# Patient Record
Sex: Female | Born: 1980 | Race: White | Hispanic: No | Marital: Married | Smoking: Never smoker
Health system: Southern US, Community
[De-identification: ages and names within clinical notes are randomized; demographics above are authoritative.]

## PROBLEM LIST (undated history)

## (undated) DIAGNOSIS — F429 Obsessive-compulsive disorder, unspecified: Secondary | ICD-10-CM

## (undated) DIAGNOSIS — O021 Missed abortion: Secondary | ICD-10-CM

## (undated) DIAGNOSIS — R51 Headache: Secondary | ICD-10-CM

## (undated) DIAGNOSIS — Q519 Congenital malformation of uterus and cervix, unspecified: Secondary | ICD-10-CM

## (undated) DIAGNOSIS — O09529 Supervision of elderly multigravida, unspecified trimester: Secondary | ICD-10-CM

## (undated) DIAGNOSIS — J069 Acute upper respiratory infection, unspecified: Secondary | ICD-10-CM

## (undated) DIAGNOSIS — K219 Gastro-esophageal reflux disease without esophagitis: Secondary | ICD-10-CM

## (undated) DIAGNOSIS — E039 Hypothyroidism, unspecified: Secondary | ICD-10-CM

## (undated) HISTORY — DX: Headache: R51

## (undated) HISTORY — DX: Obsessive-compulsive disorder, unspecified: F42.9

## (undated) HISTORY — DX: Congenital malformation of uterus and cervix, unspecified: Q51.9

## (undated) HISTORY — PX: WISDOM TOOTH EXTRACTION: SHX21

## (undated) HISTORY — DX: Hypothyroidism, unspecified: E03.9

## (undated) HISTORY — DX: Supervision of elderly multigravida, unspecified trimester: O09.529

## (undated) HISTORY — DX: Missed abortion: O02.1

## (undated) HISTORY — DX: Gastro-esophageal reflux disease without esophagitis: K21.9

## (undated) HISTORY — DX: Acute upper respiratory infection, unspecified: J06.9

---

## 2003-11-17 ENCOUNTER — Ambulatory Visit (HOSPITAL_COMMUNITY): Admission: RE | Admit: 2003-11-17 | Discharge: 2003-11-17 | Payer: Self-pay | Admitting: Family Medicine

## 2005-06-11 ENCOUNTER — Other Ambulatory Visit: Admission: RE | Admit: 2005-06-11 | Discharge: 2005-06-11 | Payer: Self-pay | Admitting: *Deleted

## 2006-08-04 ENCOUNTER — Other Ambulatory Visit: Admission: RE | Admit: 2006-08-04 | Discharge: 2006-08-04 | Payer: Self-pay | Admitting: *Deleted

## 2007-08-27 ENCOUNTER — Other Ambulatory Visit: Admission: RE | Admit: 2007-08-27 | Discharge: 2007-08-27 | Payer: Self-pay | Admitting: *Deleted

## 2008-09-14 ENCOUNTER — Other Ambulatory Visit: Admission: RE | Admit: 2008-09-14 | Discharge: 2008-09-14 | Payer: Self-pay | Admitting: Family Medicine

## 2010-12-11 ENCOUNTER — Other Ambulatory Visit: Payer: Self-pay | Admitting: Obstetrics and Gynecology

## 2010-12-11 DIAGNOSIS — Q519 Congenital malformation of uterus and cervix, unspecified: Secondary | ICD-10-CM

## 2010-12-13 ENCOUNTER — Ambulatory Visit
Admission: RE | Admit: 2010-12-13 | Discharge: 2010-12-13 | Disposition: A | Discharge status: Home / Self Care | Payer: BC Managed Care – PPO | Source: Ambulatory Visit | Attending: Obstetrics and Gynecology | Admitting: Obstetrics and Gynecology

## 2010-12-13 DIAGNOSIS — Q519 Congenital malformation of uterus and cervix, unspecified: Secondary | ICD-10-CM

## 2010-12-13 MED ORDER — GADOBENATE DIMEGLUMINE 529 MG/ML IV SOLN
15.0000 mL | Freq: Once | INTRAVENOUS | Status: AC | PRN
  Administered 2010-12-13: 15 mL via INTRAVENOUS

## 2011-01-19 ENCOUNTER — Emergency Department (HOSPITAL_BASED_OUTPATIENT_CLINIC_OR_DEPARTMENT_OTHER)
Admission: EM | Admit: 2011-01-19 | Discharge: 2011-01-20 | Disposition: A | Discharge status: Home / Self Care | Payer: BC Managed Care – PPO | Attending: Emergency Medicine | Admitting: Emergency Medicine

## 2011-01-19 DIAGNOSIS — E039 Hypothyroidism, unspecified: Secondary | ICD-10-CM | POA: Insufficient documentation

## 2011-01-19 DIAGNOSIS — R197 Diarrhea, unspecified: Secondary | ICD-10-CM | POA: Insufficient documentation

## 2011-01-19 DIAGNOSIS — K5289 Other specified noninfective gastroenteritis and colitis: Secondary | ICD-10-CM | POA: Insufficient documentation

## 2011-01-19 DIAGNOSIS — R112 Nausea with vomiting, unspecified: Secondary | ICD-10-CM | POA: Insufficient documentation

## 2011-01-19 DIAGNOSIS — Z79899 Other long term (current) drug therapy: Secondary | ICD-10-CM | POA: Insufficient documentation

## 2011-01-19 LAB — URINALYSIS, ROUTINE W REFLEX MICROSCOPIC
Bilirubin Urine: NEGATIVE
Glucose, UA: NEGATIVE mg/dL
Hgb urine dipstick: NEGATIVE
Ketones, ur: 15 mg/dL — AB
Nitrite: NEGATIVE
Protein, ur: NEGATIVE mg/dL
Specific Gravity, Urine: 1.031 — ABNORMAL HIGH (ref 1.005–1.030)
Urobilinogen, UA: 0.2 mg/dL (ref 0.0–1.0)
pH: 6 (ref 5.0–8.0)

## 2011-01-19 LAB — PREGNANCY, URINE: Preg Test, Ur: NEGATIVE

## 2011-04-12 LAB — RPR: RPR: NONREACTIVE

## 2011-04-12 LAB — RUBELLA ANTIBODY, IGM: Rubella: UNDETERMINED

## 2011-04-12 LAB — ANTIBODY SCREEN: Antibody Screen: NEGATIVE

## 2011-04-12 LAB — HEPATITIS B SURFACE ANTIGEN: Hepatitis B Surface Ag: NEGATIVE

## 2011-04-12 LAB — ABO/RH: RH Type: POSITIVE

## 2011-04-12 LAB — HIV ANTIBODY (ROUTINE TESTING W REFLEX): HIV: NONREACTIVE

## 2011-05-06 LAB — GC/CHLAMYDIA PROBE AMP, GENITAL
Chlamydia: NEGATIVE
Gonorrhea: NEGATIVE

## 2011-10-23 LAB — STREP B DNA PROBE: GBS: POSITIVE

## 2011-11-05 NOTE — L&D Delivery Note (Signed)
Delivery Note At 8:27 PM a viable and healthy female was delivered via  (Presentation: ROA ).  APGAR: 2,pending 5 min Apgar ; weight pending .   Placenta status:spontaneous and intact with  3Vessel Cord:  with the following complications: .  Cord pH: pending  Anesthesia: Epidural  Episiotomy: none  Lacerations: second degree Suture Repair: 2.0 vicryl rapide Est. Blood Loss (mL): 300 NICU in attendance to evaluate neonate  Mom to postpartum.  Baby to nursery-stable.  Sary Bogie J 11/18/2011, 8:43 PM

## 2011-11-11 ENCOUNTER — Telehealth (HOSPITAL_COMMUNITY): Payer: Self-pay | Admitting: *Deleted

## 2011-11-11 ENCOUNTER — Encounter (HOSPITAL_COMMUNITY): Payer: Self-pay | Admitting: *Deleted

## 2011-11-11 NOTE — Telephone Encounter (Signed)
Preadmission screen  

## 2011-11-17 ENCOUNTER — Other Ambulatory Visit: Payer: Self-pay | Admitting: Obstetrics and Gynecology

## 2011-11-18 ENCOUNTER — Inpatient Hospital Stay (HOSPITAL_COMMUNITY): Payer: BC Managed Care – PPO | Admitting: Anesthesiology

## 2011-11-18 ENCOUNTER — Inpatient Hospital Stay (HOSPITAL_COMMUNITY)
Admission: RE | Admit: 2011-11-18 | Discharge: 2011-11-20 | DRG: 373 | Disposition: A | Discharge status: Home / Self Care | Payer: BC Managed Care – PPO | Source: Ambulatory Visit | Attending: Obstetrics and Gynecology | Admitting: Obstetrics and Gynecology

## 2011-11-18 ENCOUNTER — Encounter (HOSPITAL_COMMUNITY): Payer: Self-pay

## 2011-11-18 ENCOUNTER — Encounter (HOSPITAL_COMMUNITY): Payer: Self-pay | Admitting: Anesthesiology

## 2011-11-18 DIAGNOSIS — Z22330 Carrier of Group B streptococcus: Secondary | ICD-10-CM

## 2011-11-18 DIAGNOSIS — E079 Disorder of thyroid, unspecified: Secondary | ICD-10-CM | POA: Diagnosis present

## 2011-11-18 DIAGNOSIS — O99892 Other specified diseases and conditions complicating childbirth: Secondary | ICD-10-CM | POA: Diagnosis present

## 2011-11-18 DIAGNOSIS — E039 Hypothyroidism, unspecified: Secondary | ICD-10-CM | POA: Diagnosis present

## 2011-11-18 DIAGNOSIS — O99284 Endocrine, nutritional and metabolic diseases complicating childbirth: Secondary | ICD-10-CM | POA: Diagnosis present

## 2011-11-18 DIAGNOSIS — O409XX0 Polyhydramnios, unspecified trimester, not applicable or unspecified: Principal | ICD-10-CM | POA: Diagnosis present

## 2011-11-18 LAB — CBC
HCT: 35.5 % — ABNORMAL LOW (ref 36.0–46.0)
Hemoglobin: 11.8 g/dL — ABNORMAL LOW (ref 12.0–15.0)
MCH: 28 pg (ref 26.0–34.0)
MCHC: 33.2 g/dL (ref 30.0–36.0)
MCV: 84.3 fL (ref 78.0–100.0)
Platelets: 198 10*3/uL (ref 150–400)
RBC: 4.21 MIL/uL (ref 3.87–5.11)
RDW: 14.3 % (ref 11.5–15.5)
WBC: 9.8 10*3/uL (ref 4.0–10.5)

## 2011-11-18 LAB — RPR: RPR Ser Ql: NONREACTIVE

## 2011-11-18 MED ORDER — PRENATAL MULTIVITAMIN CH
1.0000 | ORAL_TABLET | Freq: Every day | ORAL | Status: DC
  Administered 2011-11-19 – 2011-11-20 (×2): 1 via ORAL
  Filled 2011-11-18 (×2): qty 1

## 2011-11-18 MED ORDER — PENICILLIN G POTASSIUM 5000000 UNITS IJ SOLR
2.5000 10*6.[IU] | INTRAVENOUS | Status: DC
  Administered 2011-11-18 (×3): 2.5 10*6.[IU] via INTRAVENOUS
  Filled 2011-11-18 (×5): qty 2.5

## 2011-11-18 MED ORDER — OXYTOCIN 20 UNITS IN LACTATED RINGERS INFUSION - SIMPLE
1.0000 m[IU]/min | INTRAVENOUS | Status: DC
  Administered 2011-11-18: 2 m[IU]/min via INTRAVENOUS
  Filled 2011-11-18: qty 1000

## 2011-11-18 MED ORDER — ONDANSETRON HCL 4 MG PO TABS: 4.0000 mg | ORAL_TABLET | ORAL | Status: DC | PRN

## 2011-11-18 MED ORDER — TETANUS-DIPHTH-ACELL PERTUSSIS 5-2.5-18.5 LF-MCG/0.5 IM SUSP
0.5000 mL | Freq: Once | INTRAMUSCULAR | Status: DC
  Filled 2011-11-18: qty 0.5

## 2011-11-18 MED ORDER — WITCH HAZEL-GLYCERIN EX PADS: 1.0000 "application " | MEDICATED_PAD | CUTANEOUS | Status: DC | PRN

## 2011-11-18 MED ORDER — PENICILLIN G POTASSIUM 5000000 UNITS IJ SOLR
5.0000 10*6.[IU] | Freq: Once | INTRAVENOUS | Status: AC
  Administered 2011-11-18: 5 10*6.[IU] via INTRAVENOUS
  Filled 2011-11-18: qty 5

## 2011-11-18 MED ORDER — IBUPROFEN 600 MG PO TABS
600.0000 mg | ORAL_TABLET | Freq: Four times a day (QID) | ORAL | Status: DC
  Administered 2011-11-19 – 2011-11-20 (×7): 600 mg via ORAL
  Filled 2011-11-18 (×7): qty 1

## 2011-11-18 MED ORDER — DIPHENHYDRAMINE HCL 25 MG PO CAPS: 25.0000 mg | ORAL_CAPSULE | Freq: Four times a day (QID) | ORAL | Status: DC | PRN

## 2011-11-18 MED ORDER — LACTATED RINGERS IV SOLN: 500.0000 mL | INTRAVENOUS | Status: DC | PRN

## 2011-11-18 MED ORDER — ZOLPIDEM TARTRATE 10 MG PO TABS: 10.0000 mg | ORAL_TABLET | Freq: Every evening | ORAL | Status: DC | PRN

## 2011-11-18 MED ORDER — LEVOTHYROXINE SODIUM 75 MCG PO TABS
75.0000 ug | ORAL_TABLET | Freq: Every day | ORAL | Status: DC
  Administered 2011-11-19 – 2011-11-20 (×2): 75 ug via ORAL
  Filled 2011-11-18 (×3): qty 1

## 2011-11-18 MED ORDER — PHENYLEPHRINE 40 MCG/ML (10ML) SYRINGE FOR IV PUSH (FOR BLOOD PRESSURE SUPPORT): 80.0000 ug | PREFILLED_SYRINGE | INTRAVENOUS | Status: DC | PRN

## 2011-11-18 MED ORDER — LACTATED RINGERS IV SOLN
INTRAVENOUS | Status: DC
  Administered 2011-11-18 (×2): via INTRAVENOUS

## 2011-11-18 MED ORDER — OXYTOCIN BOLUS FROM INFUSION
500.0000 mL | Freq: Once | INTRAVENOUS | Status: DC
  Filled 2011-11-18: qty 500

## 2011-11-18 MED ORDER — EPHEDRINE 5 MG/ML INJ: 10.0000 mg | INTRAVENOUS | Status: DC | PRN

## 2011-11-18 MED ORDER — OXYTOCIN 20 UNITS IN LACTATED RINGERS INFUSION - SIMPLE: 125.0000 mL/h | INTRAVENOUS | Status: DC

## 2011-11-18 MED ORDER — FLUOXETINE HCL 20 MG PO CAPS
40.0000 mg | ORAL_CAPSULE | Freq: Two times a day (BID) | ORAL | Status: DC
  Administered 2011-11-18 – 2011-11-20 (×4): 40 mg via ORAL
  Filled 2011-11-18 (×6): qty 2

## 2011-11-18 MED ORDER — LIDOCAINE HCL 1.5 % IJ SOLN
INTRAMUSCULAR | Status: DC | PRN
  Administered 2011-11-18 (×2): 4 mL via EPIDURAL

## 2011-11-18 MED ORDER — IBUPROFEN 600 MG PO TABS: 600.0000 mg | ORAL_TABLET | Freq: Four times a day (QID) | ORAL | Status: DC | PRN

## 2011-11-18 MED ORDER — DIPHENHYDRAMINE HCL 50 MG/ML IJ SOLN: 12.5000 mg | INTRAMUSCULAR | Status: DC | PRN

## 2011-11-18 MED ORDER — ZOLPIDEM TARTRATE 5 MG PO TABS: 5.0000 mg | ORAL_TABLET | Freq: Every evening | ORAL | Status: DC | PRN

## 2011-11-18 MED ORDER — CITRIC ACID-SODIUM CITRATE 334-500 MG/5ML PO SOLN: 30.0000 mL | ORAL | Status: DC | PRN

## 2011-11-18 MED ORDER — ACETAMINOPHEN 325 MG PO TABS: 650.0000 mg | ORAL_TABLET | ORAL | Status: DC | PRN

## 2011-11-18 MED ORDER — DIBUCAINE 1 % RE OINT: 1.0000 "application " | TOPICAL_OINTMENT | RECTAL | Status: DC | PRN

## 2011-11-18 MED ORDER — FENTANYL 2.5 MCG/ML BUPIVACAINE 1/10 % EPIDURAL INFUSION (WH - ANES)
INTRAMUSCULAR | Status: DC | PRN
  Administered 2011-11-18: 14 mL/h via EPIDURAL

## 2011-11-18 MED ORDER — OXYCODONE-ACETAMINOPHEN 5-325 MG PO TABS: 1.0000 | ORAL_TABLET | ORAL | Status: DC | PRN

## 2011-11-18 MED ORDER — METHYLERGONOVINE MALEATE 0.2 MG PO TABS: 0.2000 mg | ORAL_TABLET | ORAL | Status: DC | PRN

## 2011-11-18 MED ORDER — OXYTOCIN 20 UNITS IN LACTATED RINGERS INFUSION - SIMPLE
125.0000 mL/h | Freq: Once | INTRAVENOUS | Status: AC
  Administered 2011-11-18: 125 mL/h via INTRAVENOUS

## 2011-11-18 MED ORDER — EPHEDRINE 5 MG/ML INJ
10.0000 mg | INTRAVENOUS | Status: DC | PRN
  Filled 2011-11-18: qty 4

## 2011-11-18 MED ORDER — LIDOCAINE HCL (PF) 1 % IJ SOLN
30.0000 mL | INTRAMUSCULAR | Status: DC | PRN
  Filled 2011-11-18: qty 30

## 2011-11-18 MED ORDER — FENTANYL 2.5 MCG/ML BUPIVACAINE 1/10 % EPIDURAL INFUSION (WH - ANES)
14.0000 mL/h | INTRAMUSCULAR | Status: DC
  Administered 2011-11-18: 14 mL/h via EPIDURAL
  Filled 2011-11-18 (×2): qty 60

## 2011-11-18 MED ORDER — LANOLIN HYDROUS EX OINT: TOPICAL_OINTMENT | CUTANEOUS | Status: DC | PRN

## 2011-11-18 MED ORDER — BENZOCAINE-MENTHOL 20-0.5 % EX AERO
1.0000 "application " | INHALATION_SPRAY | CUTANEOUS | Status: DC | PRN
  Administered 2011-11-19: 1 via TOPICAL

## 2011-11-18 MED ORDER — SENNOSIDES-DOCUSATE SODIUM 8.6-50 MG PO TABS
2.0000 | ORAL_TABLET | Freq: Every day | ORAL | Status: DC
  Administered 2011-11-19: 2 via ORAL

## 2011-11-18 MED ORDER — PHENYLEPHRINE 40 MCG/ML (10ML) SYRINGE FOR IV PUSH (FOR BLOOD PRESSURE SUPPORT)
80.0000 ug | PREFILLED_SYRINGE | INTRAVENOUS | Status: DC | PRN
  Filled 2011-11-18: qty 5

## 2011-11-18 MED ORDER — LACTATED RINGERS IV SOLN: 500.0000 mL | Freq: Once | INTRAVENOUS | Status: DC

## 2011-11-18 MED ORDER — ONDANSETRON HCL 4 MG/2ML IJ SOLN: 4.0000 mg | INTRAMUSCULAR | Status: DC | PRN

## 2011-11-18 MED ORDER — SIMETHICONE 80 MG PO CHEW: 80.0000 mg | CHEWABLE_TABLET | ORAL | Status: DC | PRN

## 2011-11-18 MED ORDER — OXYCODONE-ACETAMINOPHEN 5-325 MG PO TABS: 2.0000 | ORAL_TABLET | ORAL | Status: DC | PRN

## 2011-11-18 MED ORDER — FLEET ENEMA 7-19 GM/118ML RE ENEM: 1.0000 | ENEMA | RECTAL | Status: DC | PRN

## 2011-11-18 MED ORDER — METHYLERGONOVINE MALEATE 0.2 MG/ML IJ SOLN: 0.2000 mg | INTRAMUSCULAR | Status: DC | PRN

## 2011-11-18 MED ORDER — TERBUTALINE SULFATE 1 MG/ML IJ SOLN: 0.2500 mg | Freq: Once | INTRAMUSCULAR | Status: DC | PRN

## 2011-11-18 MED ORDER — ONDANSETRON HCL 4 MG/2ML IJ SOLN: 4.0000 mg | Freq: Four times a day (QID) | INTRAMUSCULAR | Status: DC | PRN

## 2011-11-18 NOTE — Anesthesia Preprocedure Evaluation (Signed)
Anesthesia Evaluation  Patient identified by MRN, date of birth, ID band Patient awake    Reviewed: Allergy & Precautions, H&P , Patient's Chart, lab work & pertinent test results  Airway Mallampati: III TM Distance: >3 FB Neck ROM: full    Dental No notable dental hx. (+) Teeth Intact   Pulmonary neg pulmonary ROS,  clear to auscultation  Pulmonary exam normal       Cardiovascular neg cardio ROS regular Normal    Neuro/Psych  Headaches, Negative Psych ROS   GI/Hepatic negative GI ROS, Neg liver ROS, Medicated and Controlled,  Endo/Other  Morbid obesity  Renal/GU negative Renal ROS  Genitourinary negative   Musculoskeletal   Abdominal   Peds  Hematology negative hematology ROS (+)   Anesthesia Other Findings   Reproductive/Obstetrics (+) Pregnancy                           Anesthesia Physical Anesthesia Plan  ASA: II  Anesthesia Plan: Epidural   Post-op Pain Management:    Induction:   Airway Management Planned:   Additional Equipment:   Intra-op Plan:   Post-operative Plan:   Informed Consent: I have reviewed the patients History and Physical, chart, labs and discussed the procedure including the risks, benefits and alternatives for the proposed anesthesia with the patient or authorized representative who has indicated his/her understanding and acceptance.     Plan Discussed with: Anesthesiologist  Anesthesia Plan Comments:         Anesthesia Quick Evaluation

## 2011-11-18 NOTE — H&P (Signed)
NAMEMARCINA, [name] NO.:  [id number]  MEDICAL RECORD NO.:  [id number]  LOCATION:  9165                          FACILITY:  [facility]  PHYSICIAN:  Lenoard Aden, M.D.DATE OF BIRTH:  [birth year]  DATE OF ADMISSION:  11/18/2011 DATE OF DISCHARGE:                             HISTORY & PHYSICAL   CHIEF COMPLAINT:  Polyhydramnios.  HISTORY OF PRESENT ILLNESS:  She is a 31 year old white female G3, P0 at [redacted] weeks gestation at 33 and 4/7th weeks gestation who presents for induction due to moderate polyhydramnios with an AFI greater than 30. She has medications to include thyroid replacement therapy, prenatal vitamins, Protonix, and Prozac.  She is a nonsmoker, nondrinker.  She denies domestic or physical violence.  She has a personal history of depression and anxiety and obsessive-compulsive disorder.  She has a pregnancy history remarkable for 2 previous pregnancy losses.  She has a family history of depression, hypertension, anxiety, colon cancer, stroke, thyroid disease, and lung cancer.  Prenatal course as noted complicated by moderate hydramnios, polyhydramnios, and group B Strep positive.  PHYSICAL EXAMINATION:  GENERAL:  She is a well-developed, well- nourished, white female, in no acute distress. VITAL SIGNS:  Weight of 208 pounds, height of 64 inches. HEENT:  Normal. NECK:  Supple.  Full range of motion. LUNGS:  Clear. HEART:  Regular rate and rhythm. ABDOMEN:  Soft, gravid, nontender. PELVIC:  Estimated fetal weight 7.5 pounds.  Cervix is 3, 70%, vertex, - 1.  EXTREMITIES:  No cords. NEUROLOGIC:  Nonfocal. SKIN:  Intact.  IMPRESSION: 1. A 39-4/7th week intrauterine pregnancy. 2. Moderate polyhydramnios with an AFI of 30.5 noted. 3. Group B strep positive. 4. History of depression.  PLAN:  Admit, induction, Pitocin, epidural as needed.  Penicillin prophylaxis, anticipate attempts at vaginal delivery.     Lenoard Aden, M.D.     RJT/MEDQ   D:  11/17/2011  T:  11/18/2011  Job:  [id number]

## 2011-11-18 NOTE — Progress Notes (Signed)
[name] is a 31 y.o. G3P0020 at [redacted]w[redacted]d by LMP admitted for moderate polyhydramnios.  Subjective: No complaints  Objective: LMP 02/15/2011      FHT:  FHR: 145 bpm, variability: moderate,  accelerations:  Present,  decelerations:  Absent UC:   irregular, every 7 minutes SVE:    2-3/8-/-1 AROM- clear and copious  Labs: No results found for this basename: WBC, HGB, HCT, MCV, PLT    Assessment / Plan: Induction of labor due to polyhydramnios,  progressing well on pitocin  Labor: AROM and Pitocin Preeclampsia:  na Fetal Wellbeing:  Category I Pain Control:  Labor support without medications I/D:  n/a Anticipated MOD:  NSVD  Purcell Jungbluth J 11/18/2011, 7:03 AM

## 2011-11-18 NOTE — Anesthesia Procedure Notes (Signed)
Epidural Patient location during procedure: OB Start time: 11/18/2011 2:22 PM  Staffing Anesthesiologist: Evoleth Nordmeyer A. Performed by: anesthesiologist   Preanesthetic Checklist Completed: patient identified, site marked, surgical consent, pre-op evaluation, timeout performed, IV checked, risks and benefits discussed and monitors and equipment checked  Epidural Patient position: sitting Prep: site prepped and draped and DuraPrep Patient monitoring: continuous pulse ox and blood pressure Approach: midline Injection technique: LOR air  Needle:  Needle type: Tuohy  Needle gauge: 17 G Needle length: 9 cm Needle insertion depth: 5 cm cm Catheter type: closed end flexible Catheter size: 19 Gauge Catheter at skin depth: 10 cm Test dose: negative and 1.5% lidocaine  Assessment Events: blood not aspirated, injection not painful, no injection resistance, negative IV test and no paresthesia  Additional Notes Patient is more comfortable after epidural dosed. Please see RN's note for documentation of vital signs and FHR which are stable.

## 2011-11-18 NOTE — Progress Notes (Signed)
[name] is a 31 y.o. G3P0020 at [redacted]w[redacted]d by LMP admitted for moderate polyhydramnios.l  Subjective: Getting uncomfortable   Objective: BP 132/86  Pulse 71  Temp(Src) 98.1 F (36.7 C) (Oral)  Resp 18  Ht 5\' 4"  (1.626 m)  Wt 94.348 kg (208 lb)  BMI 35.70 kg/m2  LMP 02/15/2011      FHT:  FHR: 155 bpm, variability: moderate,  accelerations:  Present,  decelerations:  Absent UC:   irregular, every 2-5 minutes SVE:   Dilation: 3.5 Effacement (%): 80 Station: 0 Exam by:: Dr [name]  Labs: Lab Results  Component Value Date   WBC 9.8 11/18/2011   HGB 11.8* 11/18/2011   HCT 35.5* 11/18/2011   MCV 84.3 11/18/2011   PLT 198 11/18/2011    Assessment / Plan: Induction of labor due to moderate hydramnios,  progressing well on pitocin  Labor: Progressing normally in prodromal phase. Preeclampsia:  na Fetal Wellbeing:  Category I Pain Control:  Labor support without medications I/D:  n/a Anticipated MOD:  NSVD  [name] J 11/18/2011, 2:25 PM

## 2011-11-19 ENCOUNTER — Encounter (HOSPITAL_COMMUNITY): Payer: Self-pay

## 2011-11-19 LAB — CBC
HCT: 33.3 % — ABNORMAL LOW (ref 36.0–46.0)
Hemoglobin: 10.9 g/dL — ABNORMAL LOW (ref 12.0–15.0)
MCH: 27.9 pg (ref 26.0–34.0)
MCHC: 32.7 g/dL (ref 30.0–36.0)
MCV: 85.2 fL (ref 78.0–100.0)
Platelets: 156 10*3/uL (ref 150–400)
RBC: 3.91 MIL/uL (ref 3.87–5.11)
RDW: 14.1 % (ref 11.5–15.5)
WBC: 16.3 10*3/uL — ABNORMAL HIGH (ref 4.0–10.5)

## 2011-11-19 MED ORDER — BENZOCAINE-MENTHOL 20-0.5 % EX AERO
INHALATION_SPRAY | CUTANEOUS | Status: AC
  Filled 2011-11-19: qty 56

## 2011-11-19 NOTE — Anesthesia Postprocedure Evaluation (Signed)
  Anesthesia Post-op Note  Patient: [name]  Procedure(s) Performed: * No procedures listed *  Patient Location: Mother/Baby  Anesthesia Type: Epidural  Level of Consciousness: awake, alert  and oriented  Airway and Oxygen Therapy: Patient Spontanous Breathing  Post-op Pain: mild  Post-op Assessment: Patient's Cardiovascular Status Stable, Respiratory Function Stable, Patent Airway, No signs of Nausea or vomiting and Pain level controlled  Post-op Vital Signs: stable  Complications: No apparent anesthesia complications

## 2011-11-19 NOTE — Progress Notes (Signed)
PPD 1 SVD  S:  Reports feeling well.             Tolerating po/ No nausea or vomiting             Bleeding is moderate             Pain controlled with Motrin.             Up ad lib / ambulatory  Newborn  Information for the patient's newborn:  Mattisyn, Cardona Girl Guilianna [[id number]]  female  breast feeding , no latch yet   O:  A & O x 3 NAD             VS: Blood pressure 119/78, pulse 93, temperature 97.7 F (36.5 C), temperature source Oral, resp. rate 20, height 5\' 4"  (1.626 m), weight 94.348 kg (208 lb), last menstrual period 02/15/2011, SpO2 97.00%, unknown if currently breastfeeding.  LABS:  Basename 11/19/11 0522 11/18/11 0728  HGB 10.9* 11.8*  HCT 33.3* 35.5*    I&O: I/O last 3 completed shifts: In: -  Out: 300 [Blood:300]      Lungs: Clear and unlabored  Heart: regular rate and rhythm / no mumurs  Abdomen: soft, non-tender, non-distended , obese             Fundus: firm, non-tender, @U   Perineum: repair intact, no edema  Lochia: small  Extremities: no edema, no calf pain or tenderness, neg Homans    A/P: PPD # 1 30 y.o., [id number] S/P:induced vaginal   Principal Problem:  *PP care - s/p SVD 1/14    Doing well - stable status  Routine post partum orders  Lactation support for latch today  Liani Caris, CNM, MSN 11/19/2011, 10:16 AM

## 2011-11-20 MED ORDER — IBUPROFEN 600 MG PO TABS: 600.0000 mg | ORAL_TABLET | Freq: Four times a day (QID) | ORAL | Status: AC

## 2011-11-20 MED ORDER — MEASLES, MUMPS & RUBELLA VAC ~~LOC~~ INJ
0.5000 mL | INJECTION | Freq: Once | SUBCUTANEOUS | Status: AC
  Administered 2011-11-20: 0.5 mL via SUBCUTANEOUS
  Filled 2011-11-20: qty 0.5

## 2011-11-20 NOTE — Plan of Care (Signed)
Problem: Discharge Progression Outcomes Goal: Barriers To Progression Addressed/Resolved Outcome: Not Applicable Date Met:  11/20/11 Baby staying as a baby pt because of poor feedings

## 2011-11-20 NOTE — Discharge Summary (Signed)
Obstetric Discharge Summary Reason for Admission: induction of labor - polyhydramnios / hypothyroidism Prenatal Procedures: NST and ultrasound Intrapartum Procedures: spontaneous vaginal delivery Postpartum Procedures: none Complications-Operative and Postpartum: 2nd  degree perineal laceration Hemoglobin  Date Value Range Status  11/19/2011 10.9* 12.0-15.0 (g/dL) Final     HCT  Date Value Range Status  11/19/2011 33.3* 36.0-46.0 (%) Final    Discharge Diagnoses: Term Pregnancy-delivered  Discharge Information: Date: 11/20/2011 Activity: pelvic rest Diet: routine Medications: PNV, Ibuprofen and prozac and levothyroxine Condition: stable Instructions: refer to practice specific booklet Discharge to: home   Newborn Data: Live born female  Birth Weight: 7 lb 6.5 oz (3360 g) APGAR: 2, 7  Rooming-in for next 24-48 hours with baby Home with mother when peds clears newborn for discharge.  [name] 11/20/2011, 9:48 AM

## 2011-11-20 NOTE — Progress Notes (Signed)
Patient ID: [name], female   DOB: 1981/08/17, 31 y.o.   MRN: [id number]  PPD 2 SVD  S:  Reports feeling soreness in bottom             Tolerating po/ No nausea or vomiting             Bleeding is light             Pain controlled with motrin             Up ad lib / ambulatory  Newborn breast feeding & pumping  / female newborn - not cleared for discharge today   O:  A & O x 3 NAD             VS: Blood pressure 108/71, pulse 85, temperature 98.3 F (36.8 C), temperature source Oral, resp. rate 20, height 5\' 4"  (1.626 m), weight 94.348 kg (208 lb), last menstrual period 02/15/2011, SpO2 97.00%, unknown if currently breastfeeding.  Lungs: Clear and unlabored  Heart: regular rate and rhythm / no mumurs  Abdomen: soft, non-tender, non-distended              Fundus: firm, non-tender, U+1 to Right  Perineum: resolving edema  Lochia: light  Extremities: no edema, no calf pain or tenderness    A: PPD # 2   Doing well - stable status  P:  Routine post partum orders  Discharge MOM - will room-in with baby             Reminded to empty bladder routinely every 2-4 hours             Discharge instructions reviewed  [name] 11/20/2011, 9:41 AM

## 2011-11-22 ENCOUNTER — Inpatient Hospital Stay (HOSPITAL_COMMUNITY): Admission: AD | Admit: 2011-11-22 | Payer: Self-pay | Source: Ambulatory Visit | Admitting: Obstetrics and Gynecology

## 2013-07-09 ENCOUNTER — Encounter (HOSPITAL_BASED_OUTPATIENT_CLINIC_OR_DEPARTMENT_OTHER): Payer: Self-pay | Admitting: *Deleted

## 2013-07-09 ENCOUNTER — Emergency Department (HOSPITAL_BASED_OUTPATIENT_CLINIC_OR_DEPARTMENT_OTHER)
Admission: EM | Admit: 2013-07-09 | Discharge: 2013-07-09 | Disposition: A | Discharge status: Home / Self Care | Payer: BC Managed Care – PPO | Attending: Emergency Medicine | Admitting: Emergency Medicine

## 2013-07-09 DIAGNOSIS — Z8659 Personal history of other mental and behavioral disorders: Secondary | ICD-10-CM | POA: Insufficient documentation

## 2013-07-09 DIAGNOSIS — S61209A Unspecified open wound of unspecified finger without damage to nail, initial encounter: Secondary | ICD-10-CM | POA: Insufficient documentation

## 2013-07-09 DIAGNOSIS — Z8709 Personal history of other diseases of the respiratory system: Secondary | ICD-10-CM | POA: Insufficient documentation

## 2013-07-09 DIAGNOSIS — Y929 Unspecified place or not applicable: Secondary | ICD-10-CM | POA: Insufficient documentation

## 2013-07-09 DIAGNOSIS — E039 Hypothyroidism, unspecified: Secondary | ICD-10-CM | POA: Insufficient documentation

## 2013-07-09 DIAGNOSIS — Z79899 Other long term (current) drug therapy: Secondary | ICD-10-CM | POA: Insufficient documentation

## 2013-07-09 DIAGNOSIS — Q519 Congenital malformation of uterus and cervix, unspecified: Secondary | ICD-10-CM | POA: Insufficient documentation

## 2013-07-09 DIAGNOSIS — Y939 Activity, unspecified: Secondary | ICD-10-CM | POA: Insufficient documentation

## 2013-07-09 DIAGNOSIS — W260XXA Contact with knife, initial encounter: Secondary | ICD-10-CM | POA: Insufficient documentation

## 2013-07-09 DIAGNOSIS — K219 Gastro-esophageal reflux disease without esophagitis: Secondary | ICD-10-CM | POA: Insufficient documentation

## 2013-07-09 DIAGNOSIS — Z23 Encounter for immunization: Secondary | ICD-10-CM | POA: Insufficient documentation

## 2013-07-09 MED ORDER — TETANUS-DIPHTH-ACELL PERTUSSIS 5-2.5-18.5 LF-MCG/0.5 IM SUSP
0.5000 mL | Freq: Once | INTRAMUSCULAR | Status: AC
  Administered 2013-07-09: 0.5 mL via INTRAMUSCULAR
  Filled 2013-07-09: qty 0.5

## 2013-07-09 NOTE — ED Provider Notes (Signed)
CSN: [id number]     Arrival date & time 07/09/13  1134 History   First MD Initiated Contact with Patient 07/09/13 1227     Chief Complaint  Patient presents with  . Laceration   (Consider location/radiation/quality/duration/timing/severity/associated sxs/prior Treatment) HPI Patient presents one day after sustaining fingertip injury.  She was using a knife, avulsed the distal end of her fourth digit on the left hand. Bleeding was initially controlled with pressure, dressing.  Bleeding recurred today after a dressing change.  No distal dysesthesia, weakness, inability to move the finger or hand. No other complaints, no other injuries.   Past Medical History  Diagnosis Date  . Hypothyroidism   . Other anomalies of uterus   . Missed abortion   . AMA (advanced maternal age) multigravida 35+   . Headache(784.0)   . Obsessive-compulsive disorders   . Acute upper respiratory infections of unspecified site   . GERD (gastroesophageal reflux disease)   . PP care - s/p SVD 11/19/2011   Past Surgical History  Procedure Laterality Date  . Wisdom tooth extraction     Family History  Problem Relation Age of Onset  . Hypertension Mother   . Depression Mother   . Miscarriages / [place] Mother   . Nephrolithiasis Father   . Anxiety disorder Father   . Psoriasis Father   . Hypothyroidism Sister   . Depression Sister   . Anxiety disorder Sister   . Cancer Maternal Grandfather     lung  . Hypothyroidism Paternal Grandmother   . Anxiety disorder Paternal Grandmother   . Cancer Paternal Grandfather     colon  . Stroke Paternal Grandfather    History  Substance Use Topics  . Smoking status: Never Smoker   . Smokeless tobacco: Never Used  . Alcohol Use: Yes     Comment: 5-6 drinks since pos preg test   OB History   Grav Para Term Preterm Abortions TAB SAB Ect Mult Living   3 1 1  2  2   1      Review of Systems  All other systems reviewed and are negative.    Allergies   Review of patient's allergies indicates no known allergies.  Home Medications   Current Outpatient Rx  Name  Route  Sig  Dispense  Refill  . acetaminophen (TYLENOL) 500 MG tablet   Oral   Take 500 mg by mouth every 6 (six) hours as needed. For pain         . Docosahexaenoic Acid (DHA OMEGA 3 PO)   Oral   Take 1 capsule by mouth daily.         Marland Kitchen FLUoxetine (PROZAC) 20 MG capsule   Oral   Take 40 mg by mouth 2 (two) times daily.         Marland Kitchen levothyroxine (SYNTHROID, LEVOTHROID) 75 MCG tablet   Oral   Take 75 mcg by mouth daily.         . pantoprazole (PROTONIX) 40 MG tablet   Oral   Take 40 mg by mouth daily as needed. For heartburn         . Prenatal Vit-Fe Fumarate-FA (PRENATAL MULTIVITAMIN) TABS   Oral   Take 1 tablet by mouth daily.          BP 115/74  Pulse 97  Temp(Src) 98.6 F (37 C) (Oral)  Resp 20  Ht 5\' 4"  (1.626 m)  Wt 175 lb (79.379 kg)  BMI 30.02 kg/m2  SpO2 99%  LMP  06/25/2013 Physical Exam  Nursing note and vitals reviewed. Constitutional: She appears well-developed and well-nourished. No distress.  HENT:  Head: Normocephalic and atraumatic.  Eyes: Conjunctivae are normal. Right eye exhibits no discharge. Left eye exhibits no discharge.  Cardiovascular: Normal rate, regular rhythm and intact distal pulses.   Musculoskeletal:  The distal medial third of the fourth fingertip is avulsed.  There is mild active bleeding.  Patient can flex and extend digit appropriately, Refill is appropriate.  Skin: Skin is warm and dry. She is not diaphoretic.  Psychiatric: She has a normal mood and affect.    ED Course  Procedures (including critical care time) Labs Review Labs Reviewed - No data to display Imaging Review No results found.  Wound care treatment procedure Indication active bleeding, fingertip avulsion. Consent verbal Fingertip was irrigated with tap water. Topical dressing applied. Wound care well tolerated.  MDM   1. Avulsion  of fingertip, initial encounter    Healthy young female presents several hours after avulsing the fingertip of her left fourth digit.  Woodhead dressing applied with control of bleeding.  With no new complaints, tetanus was provided and the patient was discharged in stable condition.    Gerhard Munch, MD 07/12/13 2306

## 2013-07-09 NOTE — ED Notes (Signed)
Pt reports cut left ring finger last night with knife around 2045. Unable to get bleeding to stop. Approximately 1/4 inch in length. Last tetanus in 2008 or 2010.

## 2014-09-05 ENCOUNTER — Encounter (HOSPITAL_BASED_OUTPATIENT_CLINIC_OR_DEPARTMENT_OTHER): Payer: Self-pay | Admitting: *Deleted

## 2016-09-18 DIAGNOSIS — L7211 Pilar cyst: Secondary | ICD-10-CM | POA: Diagnosis not present

## 2016-09-18 DIAGNOSIS — L700 Acne vulgaris: Secondary | ICD-10-CM | POA: Diagnosis not present

## 2016-09-18 DIAGNOSIS — D225 Melanocytic nevi of trunk: Secondary | ICD-10-CM | POA: Diagnosis not present

## 2016-09-18 DIAGNOSIS — D2261 Melanocytic nevi of right upper limb, including shoulder: Secondary | ICD-10-CM | POA: Diagnosis not present

## 2017-09-14 ENCOUNTER — Other Ambulatory Visit: Payer: Self-pay

## 2017-09-14 ENCOUNTER — Encounter (HOSPITAL_BASED_OUTPATIENT_CLINIC_OR_DEPARTMENT_OTHER): Payer: Self-pay | Admitting: Emergency Medicine

## 2017-09-14 ENCOUNTER — Emergency Department (HOSPITAL_BASED_OUTPATIENT_CLINIC_OR_DEPARTMENT_OTHER)
Admission: EM | Admit: 2017-09-14 | Discharge: 2017-09-14 | Disposition: A | Discharge status: Home / Self Care | Payer: BLUE CROSS/BLUE SHIELD | Attending: Emergency Medicine | Admitting: Emergency Medicine

## 2017-09-14 DIAGNOSIS — J029 Acute pharyngitis, unspecified: Secondary | ICD-10-CM | POA: Diagnosis not present

## 2017-09-14 DIAGNOSIS — Z79899 Other long term (current) drug therapy: Secondary | ICD-10-CM | POA: Diagnosis not present

## 2017-09-14 DIAGNOSIS — J020 Streptococcal pharyngitis: Secondary | ICD-10-CM | POA: Diagnosis not present

## 2017-09-14 DIAGNOSIS — E039 Hypothyroidism, unspecified: Secondary | ICD-10-CM | POA: Insufficient documentation

## 2017-09-14 LAB — RAPID STREP SCREEN (MED CTR MEBANE ONLY): Streptococcus, Group A Screen (Direct): POSITIVE — AB

## 2017-09-14 MED ORDER — ONDANSETRON 4 MG PO TBDP: 4.0000 mg | ORAL_TABLET | Freq: Three times a day (TID) | ORAL | 0 refills | Status: AC | PRN

## 2017-09-14 MED ORDER — ACETAMINOPHEN 325 MG PO TABS
650.0000 mg | ORAL_TABLET | Freq: Once | ORAL | Status: AC | PRN
  Administered 2017-09-14: 650 mg via ORAL
  Filled 2017-09-14: qty 2

## 2017-09-14 MED ORDER — ONDANSETRON 4 MG PO TBDP
4.0000 mg | ORAL_TABLET | Freq: Once | ORAL | Status: AC
  Administered 2017-09-14: 4 mg via ORAL
  Filled 2017-09-14: qty 1

## 2017-09-14 MED ORDER — IBUPROFEN 800 MG PO TABS: 800.0000 mg | ORAL_TABLET | Freq: Three times a day (TID) | ORAL | 0 refills | Status: DC

## 2017-09-14 MED ORDER — PENICILLIN G BENZATHINE 1200000 UNIT/2ML IM SUSP
1.2000 10*6.[IU] | Freq: Once | INTRAMUSCULAR | Status: AC
  Administered 2017-09-14: 1.2 10*6.[IU] via INTRAMUSCULAR
  Filled 2017-09-14: qty 2

## 2017-09-14 NOTE — ED Triage Notes (Signed)
Sore throat with fever and vomiting since yesterday.

## 2017-09-14 NOTE — Discharge Instructions (Signed)

## 2017-09-14 NOTE — ED Provider Notes (Signed)
[facility] EMERGENCY DEPARTMENT Provider Note   CSN: [id number] Arrival date & time: 09/14/17  1048     History   Chief Complaint Chief Complaint  Patient presents with  . Sore Throat    HPI [name] is a 36 y.o. female.  HPI  The patient is a 36 year old female, she essentially has no chronic medical conditions other than obsessive-compulsive disorder, depression and hypothyroidism for which she takes medications.  She reports that starting yesterday she developed a sore throat with no cough, she has had nausea, headache and a fever.  She is also noted some tender lymph nodes in the right side of her neck.  The patient denies abdominal pain, shortness of breath or coughing.  Her symptoms are persistent, she did take NyQuil this morning without improvement.  Past Medical History:  Diagnosis Date  . Acute upper respiratory infections of unspecified site   . AMA (advanced maternal age) multigravida 35+   . GERD (gastroesophageal reflux disease)   . Headache(784.0)   . Hypothyroidism   . Missed abortion   . Obsessive-compulsive disorders   . Other anomalies of uterus   . PP care - s/p SVD 11/19/2011    There are no active problems to display for this patient.   Past Surgical History:  Procedure Laterality Date  . WISDOM TOOTH EXTRACTION      OB History    Gravida Para Term Preterm AB Living   3 1 1   2 1    SAB TAB Ectopic Multiple Live Births   2       1       Home Medications    Prior to Admission medications   Medication Sig Start Date End Date Taking? Authorizing Provider  FLUoxetine (PROZAC) 20 MG capsule Take 40 mg by mouth 2 (two) times daily.   Yes [provider]  levothyroxine (SYNTHROID, LEVOTHROID) 75 MCG tablet Take 75 mcg by mouth daily.   Yes [provider]  acetaminophen (TYLENOL) 500 MG tablet Take 500 mg by mouth every 6 (six) hours as needed. For pain    [provider]  Docosahexaenoic Acid  (DHA OMEGA 3 PO) Take 1 capsule by mouth daily.    [provider]  ibuprofen (ADVIL,MOTRIN) 800 MG tablet Take 1 tablet (800 mg total) 3 (three) times daily by mouth. 09/14/17   Eber HongMiller, Yared Susan, MD  Prenatal Vit-Fe Fumarate-FA (PRENATAL MULTIVITAMIN) TABS Take 1 tablet by mouth daily.    [provider]    Family History Family History  Problem Relation Age of Onset  . Hypertension Mother   . Depression Mother   . Miscarriages / [place] Mother   . Nephrolithiasis Father   . Anxiety disorder Father   . Psoriasis Father   . Hypothyroidism Sister   . Depression Sister   . Anxiety disorder Sister   . Cancer Maternal Grandfather        lung  . Hypothyroidism Paternal Grandmother   . Anxiety disorder Paternal Grandmother   . Cancer Paternal Grandfather        colon  . Stroke Paternal Grandfather     Social History Social History   Tobacco Use  . Smoking status: Never Smoker  . Smokeless tobacco: Never Used  Substance Use Topics  . Alcohol use: Yes  . Drug use: No     Allergies   Patient has no known allergies.   Review of Systems Review of Systems  Constitutional: Positive for fever.  HENT:  Positive for sore throat.   Respiratory: Negative for cough and shortness of breath.      Physical Exam Updated Vital Signs BP 127/79   Pulse (!) 103   Temp (!) 101.6 F (38.7 C) (Oral)   Resp 20   Ht 5\' 4"  (1.626 m)   Wt 77.1 kg (170 lb)   LMP 08/22/2017   SpO2 100%   BMI 29.18 kg/m   Physical Exam  Constitutional: She appears well-developed and well-nourished.  HENT:  Head: Normocephalic and atraumatic.  Mouth/Throat: Uvula is midline. No oral lesions. No uvula swelling. Posterior oropharyngeal erythema present. No oropharyngeal exudate, posterior oropharyngeal edema or tonsillar abscesses.  There is signs and symptoms of strep throat including erythema of the posterior pharynx, there is no trismus or torticollis.  She has no petechiae on the  palate, no pustular or exudative lesions.  She has tender anterior lymphadenopathy of the anterior cervical chain on the right.  Eyes: Conjunctivae are normal. Right eye exhibits no discharge. Left eye exhibits no discharge.  Cardiovascular:  Mild tachycardia  Pulmonary/Chest: Effort normal. No respiratory distress.  Abdominal:  No hepatosplenomegaly  Lymphadenopathy:    She has cervical adenopathy.  Neurological: She is alert. Coordination normal.  Skin: Skin is warm and dry. No rash noted. She is not diaphoretic. No erythema.  Psychiatric: She has a normal mood and affect.  Nursing note and vitals reviewed.    ED Treatments / Results  Labs (all labs ordered are listed, but only abnormal results are displayed) Labs Reviewed  RAPID STREP SCREEN (NOT [facility]) - Abnormal; Notable for the following components:      Result Value   Streptococcus, Group A Screen (Direct) POSITIVE (*)    All other components within normal limits    Radiology No results found.  Procedures Procedures (including critical care time)  Medications Ordered in ED Medications  penicillin g benzathine (BICILLIN LA) 1200000 UNIT/2ML injection 1.2 Million Units (not administered)  acetaminophen (TYLENOL) tablet 650 mg (650 mg Oral Given 09/14/17 1127)     Initial Impression / Assessment and Plan / ED Course  I have reviewed the triage vital signs and the nursing notes.  Pertinent labs & imaging results that were available during my care of the patient were reviewed by me and considered in my medical decision making (see chart for details).     The patient has positive strep throat swab.  She has requested intramuscular penicillin as opposed to oral medications.  This was given, the patient is otherwise stable.  Final Clinical Impressions(s) / ED Diagnoses   Final diagnoses:  Strep throat    ED Discharge Orders        Ordered    ibuprofen (ADVIL,MOTRIN) 800 MG tablet  3 times daily     09/14/17  1127       Eber HongMiller, Lynanne Delgreco, MD 09/14/17 1129

## 2017-09-16 DIAGNOSIS — J020 Streptococcal pharyngitis: Secondary | ICD-10-CM | POA: Diagnosis not present

## 2017-09-23 ENCOUNTER — Encounter (HOSPITAL_BASED_OUTPATIENT_CLINIC_OR_DEPARTMENT_OTHER): Payer: Self-pay | Admitting: *Deleted

## 2017-09-23 ENCOUNTER — Emergency Department (HOSPITAL_BASED_OUTPATIENT_CLINIC_OR_DEPARTMENT_OTHER)
Admission: EM | Admit: 2017-09-23 | Discharge: 2017-09-23 | Disposition: A | Discharge status: Home / Self Care | Payer: BLUE CROSS/BLUE SHIELD | Attending: Emergency Medicine | Admitting: Emergency Medicine

## 2017-09-23 ENCOUNTER — Emergency Department (HOSPITAL_BASED_OUTPATIENT_CLINIC_OR_DEPARTMENT_OTHER): Payer: BLUE CROSS/BLUE SHIELD

## 2017-09-23 ENCOUNTER — Other Ambulatory Visit: Payer: Self-pay

## 2017-09-23 DIAGNOSIS — S0990XA Unspecified injury of head, initial encounter: Secondary | ICD-10-CM | POA: Diagnosis not present

## 2017-09-23 DIAGNOSIS — E039 Hypothyroidism, unspecified: Secondary | ICD-10-CM | POA: Diagnosis not present

## 2017-09-23 DIAGNOSIS — Y92410 Unspecified street and highway as the place of occurrence of the external cause: Secondary | ICD-10-CM | POA: Diagnosis not present

## 2017-09-23 DIAGNOSIS — Y9389 Activity, other specified: Secondary | ICD-10-CM | POA: Insufficient documentation

## 2017-09-23 DIAGNOSIS — Y998 Other external cause status: Secondary | ICD-10-CM | POA: Insufficient documentation

## 2017-09-23 DIAGNOSIS — Z79899 Other long term (current) drug therapy: Secondary | ICD-10-CM | POA: Insufficient documentation

## 2017-09-23 MED ORDER — ACETAMINOPHEN 325 MG PO TABS
650.0000 mg | ORAL_TABLET | Freq: Once | ORAL | Status: AC
  Administered 2017-09-23: 650 mg via ORAL
  Filled 2017-09-23: qty 2

## 2017-09-23 MED ORDER — CYCLOBENZAPRINE HCL 10 MG PO TABS: 10.0000 mg | ORAL_TABLET | Freq: Two times a day (BID) | ORAL | 0 refills | Status: DC | PRN

## 2017-09-23 NOTE — ED Notes (Signed)
ED Provider at bedside. 
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
## 2017-09-23 NOTE — Discharge Instructions (Signed)
The pain your experiencing is likely due to muscle strain, you may take Ibuprofen and flexeril as needed for pain management. Do not combine with any pain reliever other than tylenol. The muscle soreness should improve over the next week. Follow up with your family doctor in the next week for a recheck if you are still having symptoms. Return to ED if pain is worsening, you develop weakness or numbness of extremities, or new or concerning symptoms develop.

## 2017-10-07 DIAGNOSIS — F3342 Major depressive disorder, recurrent, in full remission: Secondary | ICD-10-CM | POA: Diagnosis not present

## 2017-10-10 DIAGNOSIS — R110 Nausea: Secondary | ICD-10-CM | POA: Diagnosis not present

## 2017-10-17 DIAGNOSIS — K2930 Chronic superficial gastritis without bleeding: Secondary | ICD-10-CM | POA: Diagnosis not present

## 2017-10-17 DIAGNOSIS — K2900 Acute gastritis without bleeding: Secondary | ICD-10-CM | POA: Diagnosis not present

## 2017-10-17 DIAGNOSIS — K259 Gastric ulcer, unspecified as acute or chronic, without hemorrhage or perforation: Secondary | ICD-10-CM | POA: Diagnosis not present

## 2017-10-17 DIAGNOSIS — R110 Nausea: Secondary | ICD-10-CM | POA: Diagnosis not present

## 2017-10-22 DIAGNOSIS — K2900 Acute gastritis without bleeding: Secondary | ICD-10-CM | POA: Diagnosis not present

## 2017-10-22 DIAGNOSIS — K2930 Chronic superficial gastritis without bleeding: Secondary | ICD-10-CM | POA: Diagnosis not present

## 2017-11-05 ENCOUNTER — Other Ambulatory Visit (HOSPITAL_COMMUNITY): Payer: Self-pay | Admitting: Gastroenterology

## 2017-11-05 DIAGNOSIS — R110 Nausea: Secondary | ICD-10-CM

## 2017-11-17 ENCOUNTER — Ambulatory Visit (HOSPITAL_COMMUNITY): Payer: BLUE CROSS/BLUE SHIELD

## 2017-11-25 DIAGNOSIS — L918 Other hypertrophic disorders of the skin: Secondary | ICD-10-CM | POA: Diagnosis not present

## 2017-11-25 DIAGNOSIS — D2239 Melanocytic nevi of other parts of face: Secondary | ICD-10-CM | POA: Diagnosis not present

## 2017-11-25 DIAGNOSIS — D2261 Melanocytic nevi of right upper limb, including shoulder: Secondary | ICD-10-CM | POA: Diagnosis not present

## 2017-11-25 DIAGNOSIS — L7211 Pilar cyst: Secondary | ICD-10-CM | POA: Diagnosis not present

## 2017-11-27 DIAGNOSIS — R110 Nausea: Secondary | ICD-10-CM | POA: Diagnosis not present

## 2017-12-03 ENCOUNTER — Encounter (HOSPITAL_COMMUNITY): Payer: Self-pay

## 2017-12-03 ENCOUNTER — Ambulatory Visit (HOSPITAL_COMMUNITY)
Admission: RE | Admit: 2017-12-03 | Discharge: 2017-12-03 | Disposition: A | Discharge status: Home / Self Care | Payer: BLUE CROSS/BLUE SHIELD | Source: Ambulatory Visit | Attending: Gastroenterology | Admitting: Gastroenterology

## 2018-02-02 DIAGNOSIS — R110 Nausea: Secondary | ICD-10-CM | POA: Diagnosis not present

## 2018-04-09 DIAGNOSIS — R110 Nausea: Secondary | ICD-10-CM | POA: Diagnosis not present

## 2018-04-28 ENCOUNTER — Other Ambulatory Visit (HOSPITAL_COMMUNITY)
Admission: RE | Admit: 2018-04-28 | Discharge: 2018-04-28 | Disposition: A | Discharge status: Home / Self Care | Payer: BLUE CROSS/BLUE SHIELD | Source: Ambulatory Visit | Attending: Family Medicine | Admitting: Family Medicine

## 2018-04-28 ENCOUNTER — Other Ambulatory Visit: Payer: Self-pay | Admitting: Family Medicine

## 2018-04-28 DIAGNOSIS — E039 Hypothyroidism, unspecified: Secondary | ICD-10-CM | POA: Diagnosis not present

## 2018-04-28 DIAGNOSIS — E785 Hyperlipidemia, unspecified: Secondary | ICD-10-CM | POA: Diagnosis not present

## 2018-04-28 DIAGNOSIS — Z01419 Encounter for gynecological examination (general) (routine) without abnormal findings: Secondary | ICD-10-CM | POA: Insufficient documentation

## 2018-04-28 DIAGNOSIS — Z0000 Encounter for general adult medical examination without abnormal findings: Secondary | ICD-10-CM | POA: Diagnosis not present

## 2018-04-28 DIAGNOSIS — Z124 Encounter for screening for malignant neoplasm of cervix: Secondary | ICD-10-CM | POA: Diagnosis not present

## 2018-04-30 LAB — CYTOLOGY - PAP: Diagnosis: NEGATIVE

## 2018-12-30 DIAGNOSIS — D2272 Melanocytic nevi of left lower limb, including hip: Secondary | ICD-10-CM | POA: Diagnosis not present

## 2018-12-30 DIAGNOSIS — L700 Acne vulgaris: Secondary | ICD-10-CM | POA: Diagnosis not present

## 2018-12-30 DIAGNOSIS — D225 Melanocytic nevi of trunk: Secondary | ICD-10-CM | POA: Diagnosis not present

## 2018-12-30 DIAGNOSIS — L7211 Pilar cyst: Secondary | ICD-10-CM | POA: Diagnosis not present

## 2019-01-08 DIAGNOSIS — J029 Acute pharyngitis, unspecified: Secondary | ICD-10-CM | POA: Diagnosis not present

## 2019-01-12 DIAGNOSIS — R110 Nausea: Secondary | ICD-10-CM | POA: Diagnosis not present

## 2019-06-23 DIAGNOSIS — Z0000 Encounter for general adult medical examination without abnormal findings: Secondary | ICD-10-CM | POA: Diagnosis not present

## 2019-06-30 DIAGNOSIS — E785 Hyperlipidemia, unspecified: Secondary | ICD-10-CM | POA: Diagnosis not present

## 2019-06-30 DIAGNOSIS — Z0000 Encounter for general adult medical examination without abnormal findings: Secondary | ICD-10-CM | POA: Diagnosis not present

## 2019-06-30 DIAGNOSIS — E039 Hypothyroidism, unspecified: Secondary | ICD-10-CM | POA: Diagnosis not present

## 2019-06-30 DIAGNOSIS — E559 Vitamin D deficiency, unspecified: Secondary | ICD-10-CM | POA: Diagnosis not present

## 2019-08-30 ENCOUNTER — Other Ambulatory Visit: Payer: Self-pay

## 2019-08-30 DIAGNOSIS — Z20822 Contact with and (suspected) exposure to covid-19: Secondary | ICD-10-CM

## 2019-08-30 DIAGNOSIS — Z20828 Contact with and (suspected) exposure to other viral communicable diseases: Secondary | ICD-10-CM | POA: Diagnosis not present

## 2019-09-01 LAB — NOVEL CORONAVIRUS, NAA: SARS-CoV-2, NAA: NOT DETECTED

## 2019-12-14 ENCOUNTER — Ambulatory Visit: Payer: Self-pay

## 2019-12-16 ENCOUNTER — Ambulatory Visit: Payer: Self-pay

## 2019-12-29 DIAGNOSIS — B354 Tinea corporis: Secondary | ICD-10-CM | POA: Diagnosis not present

## 2020-01-28 DIAGNOSIS — R110 Nausea: Secondary | ICD-10-CM | POA: Diagnosis not present

## 2020-02-16 DIAGNOSIS — D2271 Melanocytic nevi of right lower limb, including hip: Secondary | ICD-10-CM | POA: Diagnosis not present

## 2020-02-16 DIAGNOSIS — D2272 Melanocytic nevi of left lower limb, including hip: Secondary | ICD-10-CM | POA: Diagnosis not present

## 2020-02-16 DIAGNOSIS — D225 Melanocytic nevi of trunk: Secondary | ICD-10-CM | POA: Diagnosis not present

## 2020-02-16 DIAGNOSIS — L723 Sebaceous cyst: Secondary | ICD-10-CM | POA: Diagnosis not present

## 2020-02-22 ENCOUNTER — Other Ambulatory Visit: Payer: Self-pay | Admitting: Physician Assistant

## 2020-02-22 ENCOUNTER — Ambulatory Visit
Admission: RE | Admit: 2020-02-22 | Discharge: 2020-02-22 | Disposition: A | Discharge status: Home / Self Care | Payer: BC Managed Care – PPO | Source: Ambulatory Visit | Attending: Physician Assistant | Admitting: Physician Assistant

## 2020-02-22 DIAGNOSIS — M79671 Pain in right foot: Secondary | ICD-10-CM | POA: Diagnosis not present

## 2020-03-06 DIAGNOSIS — E785 Hyperlipidemia, unspecified: Secondary | ICD-10-CM | POA: Diagnosis not present

## 2020-03-06 DIAGNOSIS — E559 Vitamin D deficiency, unspecified: Secondary | ICD-10-CM | POA: Diagnosis not present

## 2020-03-06 DIAGNOSIS — D529 Folate deficiency anemia, unspecified: Secondary | ICD-10-CM | POA: Diagnosis not present

## 2020-03-06 DIAGNOSIS — Z79899 Other long term (current) drug therapy: Secondary | ICD-10-CM | POA: Diagnosis not present

## 2020-05-20 DIAGNOSIS — S20469A Insect bite (nonvenomous) of unspecified back wall of thorax, initial encounter: Secondary | ICD-10-CM | POA: Diagnosis not present

## 2020-05-20 DIAGNOSIS — W57XXXA Bitten or stung by nonvenomous insect and other nonvenomous arthropods, initial encounter: Secondary | ICD-10-CM | POA: Diagnosis not present

## 2020-05-20 DIAGNOSIS — L309 Dermatitis, unspecified: Secondary | ICD-10-CM | POA: Diagnosis not present

## 2020-07-04 DIAGNOSIS — Z0000 Encounter for general adult medical examination without abnormal findings: Secondary | ICD-10-CM | POA: Diagnosis not present

## 2020-08-29 DIAGNOSIS — N6320 Unspecified lump in the left breast, unspecified quadrant: Secondary | ICD-10-CM | POA: Diagnosis not present

## 2020-09-19 DIAGNOSIS — N6489 Other specified disorders of breast: Secondary | ICD-10-CM | POA: Diagnosis not present

## 2020-09-19 DIAGNOSIS — R928 Other abnormal and inconclusive findings on diagnostic imaging of breast: Secondary | ICD-10-CM | POA: Diagnosis not present

## 2020-10-02 DIAGNOSIS — F3342 Major depressive disorder, recurrent, in full remission: Secondary | ICD-10-CM | POA: Diagnosis not present

## 2021-02-14 DIAGNOSIS — M25552 Pain in left hip: Secondary | ICD-10-CM | POA: Diagnosis not present

## 2021-02-28 DIAGNOSIS — D2262 Melanocytic nevi of left upper limb, including shoulder: Secondary | ICD-10-CM | POA: Diagnosis not present

## 2021-02-28 DIAGNOSIS — L7211 Pilar cyst: Secondary | ICD-10-CM | POA: Diagnosis not present

## 2021-02-28 DIAGNOSIS — D225 Melanocytic nevi of trunk: Secondary | ICD-10-CM | POA: Diagnosis not present

## 2021-02-28 DIAGNOSIS — D2272 Melanocytic nevi of left lower limb, including hip: Secondary | ICD-10-CM | POA: Diagnosis not present

## 2021-03-06 DIAGNOSIS — M25552 Pain in left hip: Secondary | ICD-10-CM | POA: Diagnosis not present

## 2021-03-06 DIAGNOSIS — M7060 Trochanteric bursitis, unspecified hip: Secondary | ICD-10-CM | POA: Diagnosis not present

## 2021-03-13 DIAGNOSIS — M7060 Trochanteric bursitis, unspecified hip: Secondary | ICD-10-CM | POA: Diagnosis not present

## 2021-03-13 DIAGNOSIS — M25552 Pain in left hip: Secondary | ICD-10-CM | POA: Diagnosis not present

## 2021-03-27 DIAGNOSIS — M25552 Pain in left hip: Secondary | ICD-10-CM | POA: Diagnosis not present

## 2021-03-27 DIAGNOSIS — M7060 Trochanteric bursitis, unspecified hip: Secondary | ICD-10-CM | POA: Diagnosis not present

## 2021-04-03 DIAGNOSIS — M25552 Pain in left hip: Secondary | ICD-10-CM | POA: Diagnosis not present

## 2021-04-03 DIAGNOSIS — M7060 Trochanteric bursitis, unspecified hip: Secondary | ICD-10-CM | POA: Diagnosis not present

## 2021-04-11 DIAGNOSIS — M25552 Pain in left hip: Secondary | ICD-10-CM | POA: Diagnosis not present

## 2021-04-11 DIAGNOSIS — M7060 Trochanteric bursitis, unspecified hip: Secondary | ICD-10-CM | POA: Diagnosis not present

## 2021-05-31 DIAGNOSIS — M7060 Trochanteric bursitis, unspecified hip: Secondary | ICD-10-CM | POA: Diagnosis not present

## 2021-05-31 DIAGNOSIS — M25552 Pain in left hip: Secondary | ICD-10-CM | POA: Diagnosis not present

## 2021-06-13 DIAGNOSIS — M25552 Pain in left hip: Secondary | ICD-10-CM | POA: Diagnosis not present

## 2021-06-13 DIAGNOSIS — M7060 Trochanteric bursitis, unspecified hip: Secondary | ICD-10-CM | POA: Diagnosis not present

## 2021-06-28 DIAGNOSIS — M7060 Trochanteric bursitis, unspecified hip: Secondary | ICD-10-CM | POA: Diagnosis not present

## 2021-06-28 DIAGNOSIS — M25552 Pain in left hip: Secondary | ICD-10-CM | POA: Diagnosis not present

## 2021-08-01 DIAGNOSIS — M25552 Pain in left hip: Secondary | ICD-10-CM | POA: Diagnosis not present

## 2021-08-01 DIAGNOSIS — M7060 Trochanteric bursitis, unspecified hip: Secondary | ICD-10-CM | POA: Diagnosis not present

## 2021-08-09 DIAGNOSIS — M7060 Trochanteric bursitis, unspecified hip: Secondary | ICD-10-CM | POA: Diagnosis not present

## 2021-08-09 DIAGNOSIS — M25552 Pain in left hip: Secondary | ICD-10-CM | POA: Diagnosis not present

## 2021-08-15 ENCOUNTER — Other Ambulatory Visit: Payer: Self-pay | Admitting: Family Medicine

## 2021-08-15 DIAGNOSIS — E039 Hypothyroidism, unspecified: Secondary | ICD-10-CM | POA: Diagnosis not present

## 2021-08-15 DIAGNOSIS — N926 Irregular menstruation, unspecified: Secondary | ICD-10-CM | POA: Diagnosis not present

## 2021-08-15 DIAGNOSIS — K219 Gastro-esophageal reflux disease without esophagitis: Secondary | ICD-10-CM | POA: Diagnosis not present

## 2021-08-15 DIAGNOSIS — Z23 Encounter for immunization: Secondary | ICD-10-CM | POA: Diagnosis not present

## 2021-08-15 DIAGNOSIS — Z0000 Encounter for general adult medical examination without abnormal findings: Secondary | ICD-10-CM | POA: Diagnosis not present

## 2021-08-15 DIAGNOSIS — Z124 Encounter for screening for malignant neoplasm of cervix: Secondary | ICD-10-CM | POA: Diagnosis not present

## 2021-08-15 DIAGNOSIS — E559 Vitamin D deficiency, unspecified: Secondary | ICD-10-CM | POA: Diagnosis not present

## 2021-08-15 DIAGNOSIS — E785 Hyperlipidemia, unspecified: Secondary | ICD-10-CM | POA: Diagnosis not present

## 2021-08-16 ENCOUNTER — Ambulatory Visit
Admission: RE | Admit: 2021-08-16 | Discharge: 2021-08-16 | Disposition: A | Discharge status: Home / Self Care | Payer: BC Managed Care – PPO | Source: Ambulatory Visit | Attending: Family Medicine | Admitting: Family Medicine

## 2021-08-16 DIAGNOSIS — N921 Excessive and frequent menstruation with irregular cycle: Secondary | ICD-10-CM | POA: Diagnosis not present

## 2021-08-16 DIAGNOSIS — N926 Irregular menstruation, unspecified: Secondary | ICD-10-CM

## 2021-08-16 DIAGNOSIS — N888 Other specified noninflammatory disorders of cervix uteri: Secondary | ICD-10-CM | POA: Diagnosis not present

## 2021-08-16 DIAGNOSIS — D259 Leiomyoma of uterus, unspecified: Secondary | ICD-10-CM | POA: Diagnosis not present

## 2021-08-30 ENCOUNTER — Ambulatory Visit
Admission: RE | Admit: 2021-08-30 | Discharge: 2021-08-30 | Disposition: A | Discharge status: Home / Self Care | Payer: BC Managed Care – PPO | Source: Ambulatory Visit | Attending: Internal Medicine | Admitting: Internal Medicine

## 2021-08-30 ENCOUNTER — Other Ambulatory Visit: Payer: Self-pay

## 2021-08-30 ENCOUNTER — Other Ambulatory Visit: Payer: Self-pay | Admitting: Internal Medicine

## 2021-08-30 DIAGNOSIS — M79671 Pain in right foot: Secondary | ICD-10-CM | POA: Diagnosis not present

## 2021-09-13 DIAGNOSIS — M7060 Trochanteric bursitis, unspecified hip: Secondary | ICD-10-CM | POA: Diagnosis not present

## 2021-09-13 DIAGNOSIS — M25552 Pain in left hip: Secondary | ICD-10-CM | POA: Diagnosis not present

## 2021-10-09 DIAGNOSIS — Z1231 Encounter for screening mammogram for malignant neoplasm of breast: Secondary | ICD-10-CM | POA: Diagnosis not present

## 2021-10-16 DIAGNOSIS — R928 Other abnormal and inconclusive findings on diagnostic imaging of breast: Secondary | ICD-10-CM | POA: Diagnosis not present

## 2021-10-23 DIAGNOSIS — M7060 Trochanteric bursitis, unspecified hip: Secondary | ICD-10-CM | POA: Diagnosis not present

## 2021-10-23 DIAGNOSIS — M25552 Pain in left hip: Secondary | ICD-10-CM | POA: Diagnosis not present

## 2021-11-30 IMAGING — US US PELVIS COMPLETE WITH TRANSVAGINAL
1 series · 13 of 25 positions shown · non-contrast
Comparison: None

CLINICAL DATA: Irregular menstrual cycle breakthrough bleeding for
years worse in past 6 months, LMP 07/19/2021



[Series 1: us pelvis complete with transvaginal · 0.30mm/px · 13 of 80 slices shown]
[im 1/80]
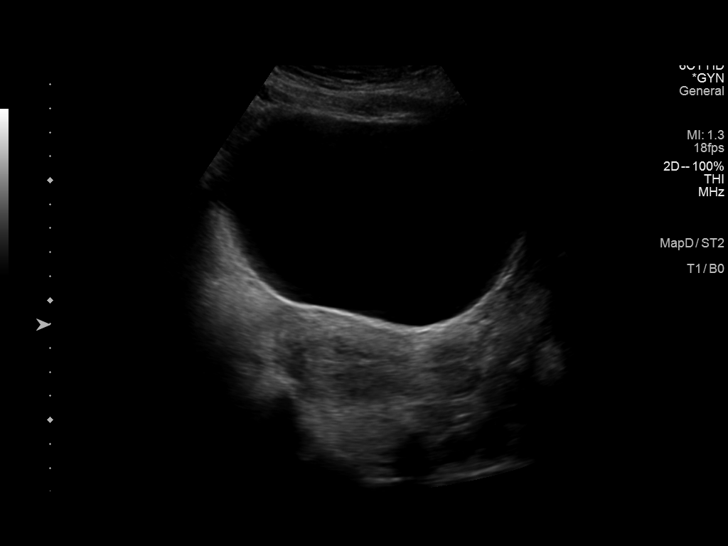
[im 7/80]
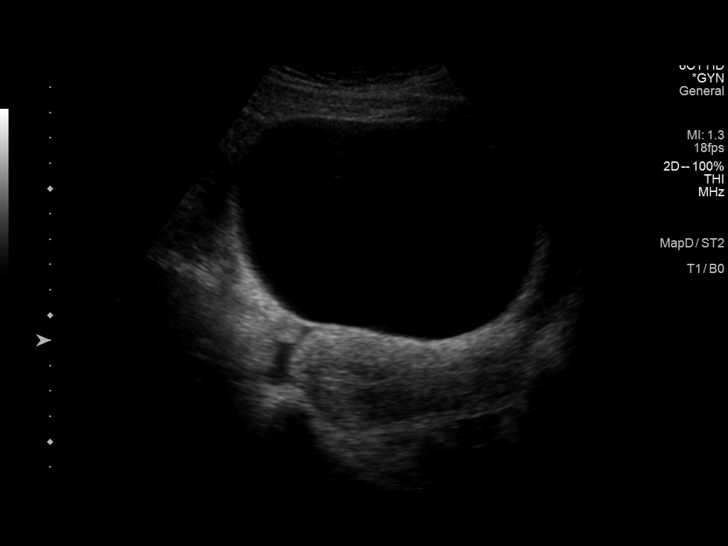
[im 14/80]
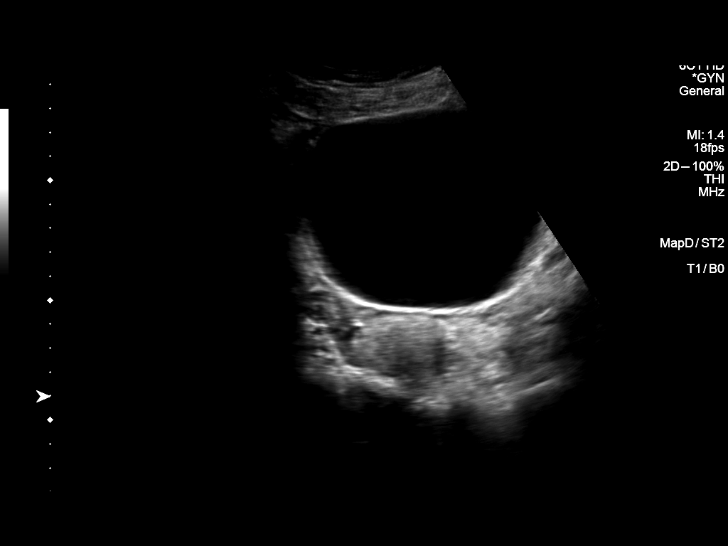
[im 20/80]
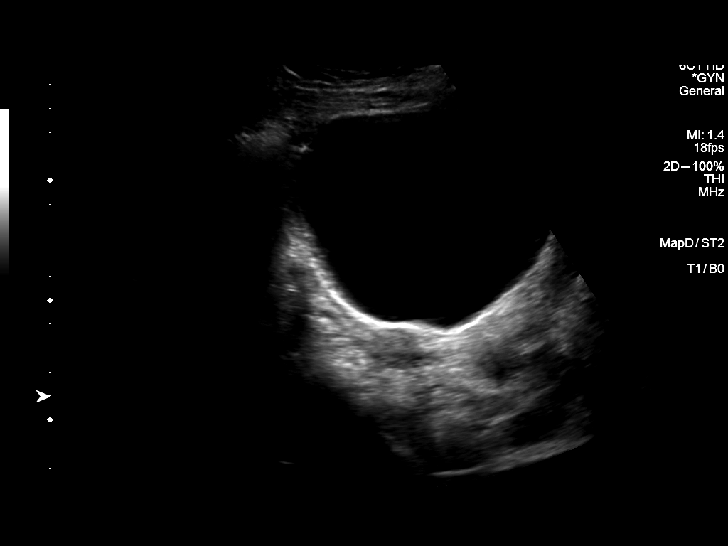
[im 27/80]
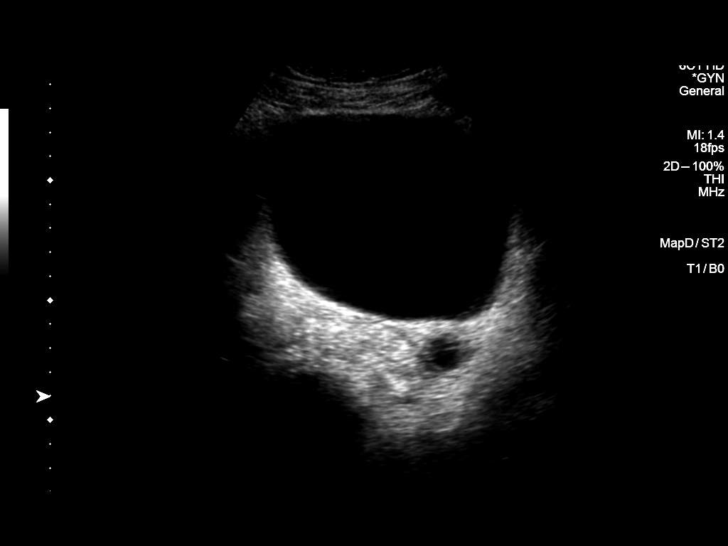
[im 33/80]
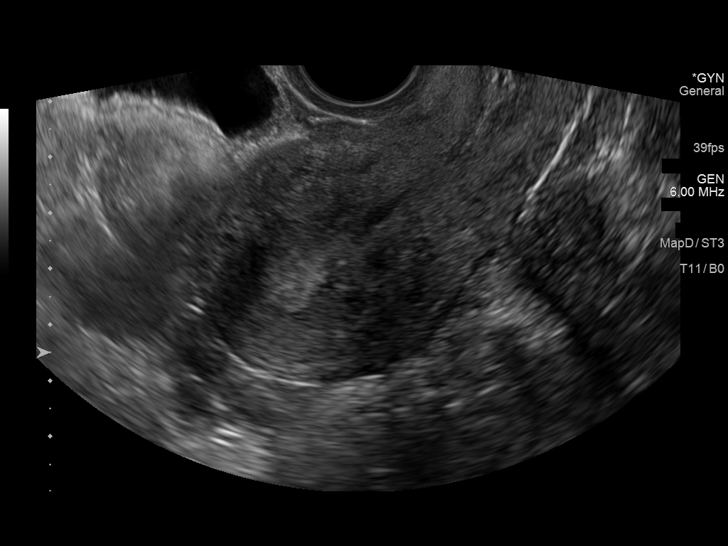
[im 40/80]
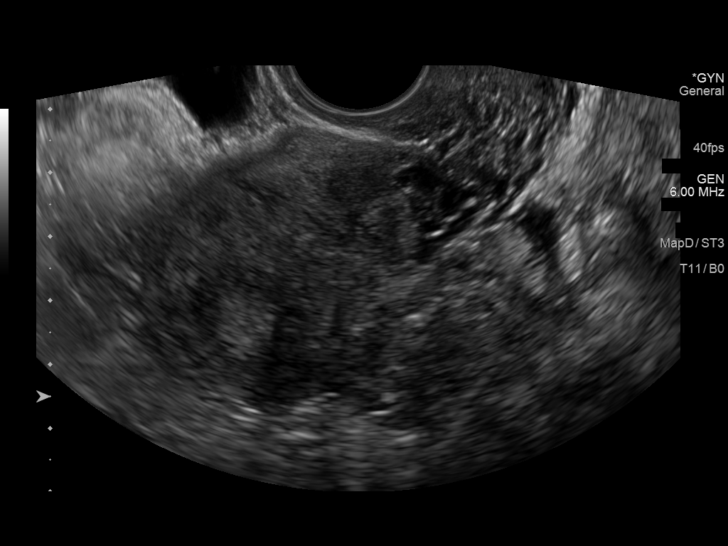
[im 47/80]
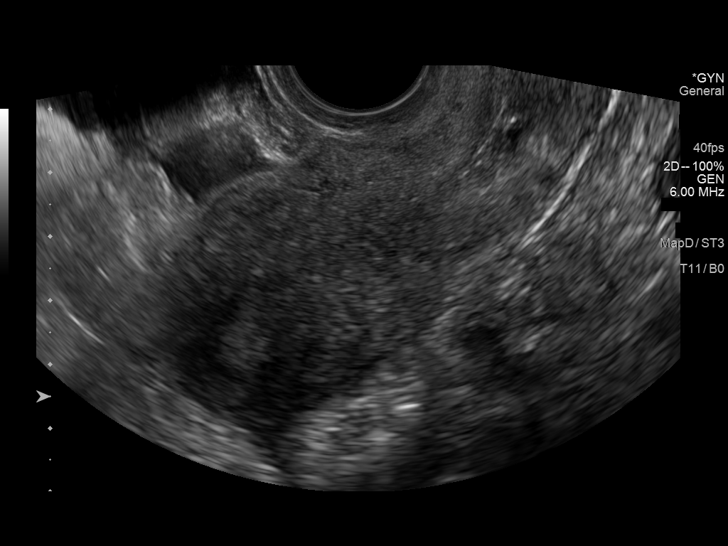
[im 53/80]
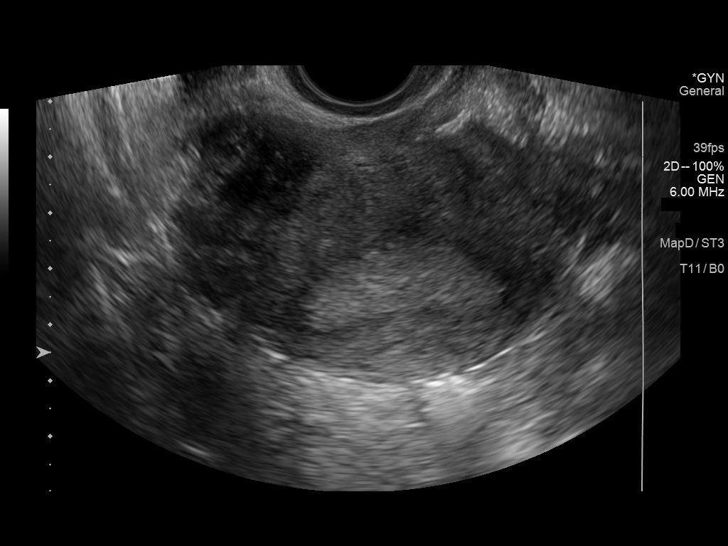
[im 60/80]
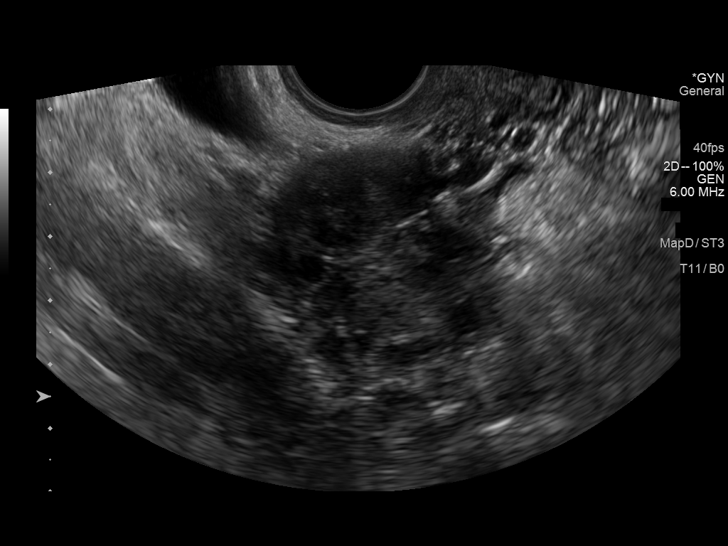
[im 66/80]
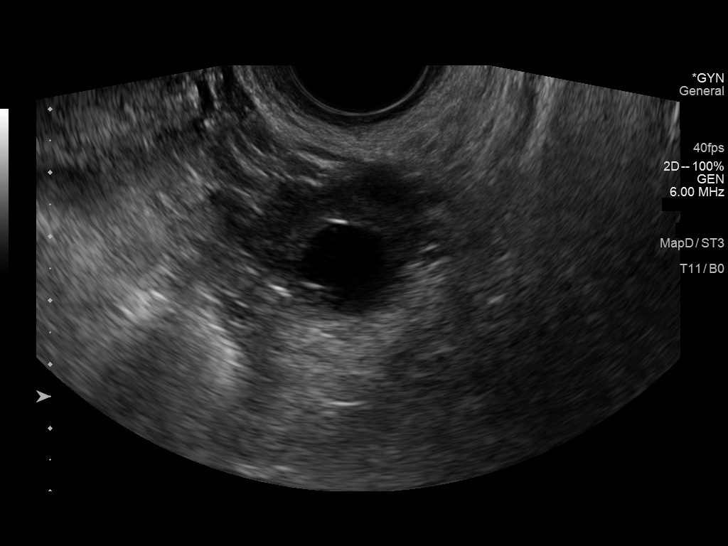
[im 73/80]
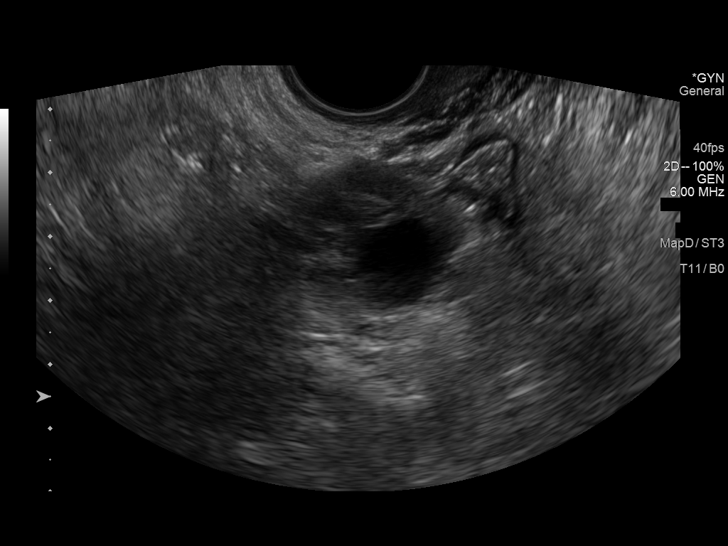
[im 80/80]
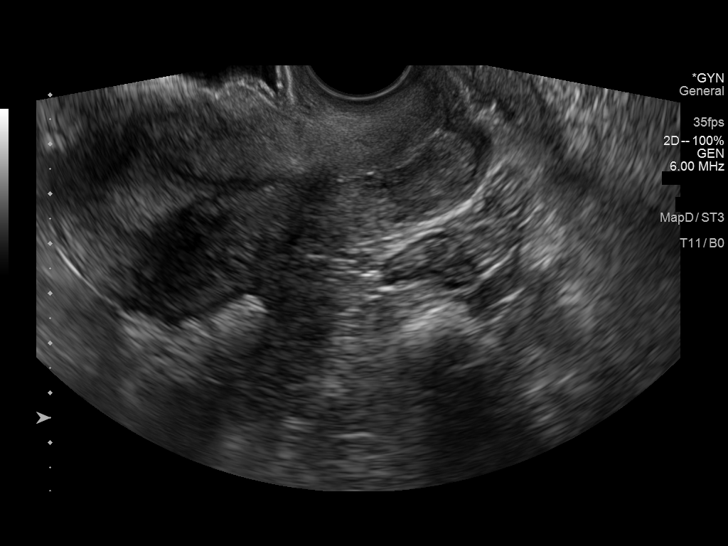

[13 of 25 positions shown; findings below may reference images not displayed]

FINDINGS: Uterus

Measurements: 8.9 x 4.1 x 5.5 cm = volume: 105 mL. Exophytic
leiomyoma anterior RIGHT uterus 2.6 x 2.0 x 2.2 cm. No additional
uterine masses. Mildly heterogeneous myometrium. Nabothian cyst at
cervix.

Endometrium

Thickness: 11 mm.  No endometrial fluid or mass

Right ovary

Measurements: 3.4 x 2.1 x 3.1 cm = volume: 11.3 mL. Normal
morphology without mass

Left ovary

Measurements: 3.5 x 2.5 x 3.0 cm = volume: 14.5 mL. Normal
morphology without mass

Other findings

No free pelvic fluid.  No adnexal masses.
IMPRESSION: Exophytic 2.6 cm diameter leiomyoma from anterior RIGHT uterus.

Remainder of exam unremarkable.

## 2022-03-27 DIAGNOSIS — D225 Melanocytic nevi of trunk: Secondary | ICD-10-CM | POA: Diagnosis not present

## 2022-03-27 DIAGNOSIS — L7211 Pilar cyst: Secondary | ICD-10-CM | POA: Diagnosis not present

## 2022-03-27 DIAGNOSIS — D2272 Melanocytic nevi of left lower limb, including hip: Secondary | ICD-10-CM | POA: Diagnosis not present

## 2022-03-27 DIAGNOSIS — D2271 Melanocytic nevi of right lower limb, including hip: Secondary | ICD-10-CM | POA: Diagnosis not present

## 2022-05-29 DIAGNOSIS — M25511 Pain in right shoulder: Secondary | ICD-10-CM | POA: Diagnosis not present

## 2022-05-29 DIAGNOSIS — M7060 Trochanteric bursitis, unspecified hip: Secondary | ICD-10-CM | POA: Diagnosis not present

## 2022-05-29 DIAGNOSIS — M25552 Pain in left hip: Secondary | ICD-10-CM | POA: Diagnosis not present

## 2022-06-13 DIAGNOSIS — M25552 Pain in left hip: Secondary | ICD-10-CM | POA: Diagnosis not present

## 2022-06-13 DIAGNOSIS — M7060 Trochanteric bursitis, unspecified hip: Secondary | ICD-10-CM | POA: Diagnosis not present

## 2022-06-13 DIAGNOSIS — M25511 Pain in right shoulder: Secondary | ICD-10-CM | POA: Diagnosis not present

## 2022-07-04 DIAGNOSIS — M25511 Pain in right shoulder: Secondary | ICD-10-CM | POA: Diagnosis not present

## 2022-07-04 DIAGNOSIS — M7060 Trochanteric bursitis, unspecified hip: Secondary | ICD-10-CM | POA: Diagnosis not present

## 2022-07-04 DIAGNOSIS — M25552 Pain in left hip: Secondary | ICD-10-CM | POA: Diagnosis not present

## 2022-08-20 DIAGNOSIS — E039 Hypothyroidism, unspecified: Secondary | ICD-10-CM | POA: Diagnosis not present

## 2022-08-20 DIAGNOSIS — Z131 Encounter for screening for diabetes mellitus: Secondary | ICD-10-CM | POA: Diagnosis not present

## 2022-08-20 DIAGNOSIS — Z23 Encounter for immunization: Secondary | ICD-10-CM | POA: Diagnosis not present

## 2022-08-20 DIAGNOSIS — N926 Irregular menstruation, unspecified: Secondary | ICD-10-CM | POA: Diagnosis not present

## 2022-08-20 DIAGNOSIS — E673 Hypervitaminosis D: Secondary | ICD-10-CM | POA: Diagnosis not present

## 2022-08-20 DIAGNOSIS — K649 Unspecified hemorrhoids: Secondary | ICD-10-CM | POA: Diagnosis not present

## 2022-08-20 DIAGNOSIS — Z0000 Encounter for general adult medical examination without abnormal findings: Secondary | ICD-10-CM | POA: Diagnosis not present

## 2022-08-20 DIAGNOSIS — E785 Hyperlipidemia, unspecified: Secondary | ICD-10-CM | POA: Diagnosis not present

## 2022-08-27 ENCOUNTER — Encounter (HOSPITAL_BASED_OUTPATIENT_CLINIC_OR_DEPARTMENT_OTHER): Payer: Self-pay

## 2022-08-27 ENCOUNTER — Other Ambulatory Visit: Payer: Self-pay

## 2022-08-27 ENCOUNTER — Emergency Department (HOSPITAL_BASED_OUTPATIENT_CLINIC_OR_DEPARTMENT_OTHER): Payer: BC Managed Care – PPO

## 2022-08-27 DIAGNOSIS — J101 Influenza due to other identified influenza virus with other respiratory manifestations: Secondary | ICD-10-CM | POA: Diagnosis not present

## 2022-08-27 DIAGNOSIS — Z20822 Contact with and (suspected) exposure to covid-19: Secondary | ICD-10-CM | POA: Insufficient documentation

## 2022-08-27 DIAGNOSIS — R059 Cough, unspecified: Secondary | ICD-10-CM | POA: Diagnosis not present

## 2022-08-27 DIAGNOSIS — R509 Fever, unspecified: Secondary | ICD-10-CM | POA: Diagnosis not present

## 2022-08-27 LAB — RESP PANEL BY RT-PCR (FLU A&B, COVID) ARPGX2
Influenza A by PCR: POSITIVE — AB
Influenza B by PCR: NEGATIVE
SARS Coronavirus 2 by RT PCR: NEGATIVE

## 2022-08-27 LAB — GROUP A STREP BY PCR: Group A Strep by PCR: NOT DETECTED

## 2022-08-27 NOTE — ED Triage Notes (Signed)
Last night pt developed fever, body aches, sore throat. Denies SOB, N/V, CP. Pt took tylenol 2hrs ago.

## 2022-08-28 ENCOUNTER — Emergency Department (HOSPITAL_BASED_OUTPATIENT_CLINIC_OR_DEPARTMENT_OTHER)
Admission: EM | Admit: 2022-08-28 | Discharge: 2022-08-28 | Disposition: A | Discharge status: Home / Self Care | Payer: BC Managed Care – PPO | Attending: Emergency Medicine | Admitting: Emergency Medicine

## 2022-08-28 DIAGNOSIS — J101 Influenza due to other identified influenza virus with other respiratory manifestations: Secondary | ICD-10-CM

## 2022-08-28 MED ORDER — OSELTAMIVIR PHOSPHATE 75 MG PO CAPS: 75.0000 mg | ORAL_CAPSULE | Freq: Two times a day (BID) | ORAL | 0 refills | Status: DC

## 2022-08-28 NOTE — ED Provider Notes (Signed)
[facility] EMERGENCY DEPARTMENT Provider Note   CSN: [id number] Arrival date & time: 08/27/22  2108     History  Chief Complaint  Patient presents with   Fever    [name] is a 41 y.o. female.  The history is provided by the patient.  Fever She has history of hypothyroidism and comes in with onset night before last of the sore throat and onset yesterday of fever and chills.  She has not taken her temperature.  She is also complaining of generalized body aches.  Her husband has a similar illness.  She denies nausea or vomiting.  There has been a nonproductive cough.   Home Medications Prior to Admission medications   Medication Sig Start Date End Date Taking? Authorizing Provider  oseltamivir (TAMIFLU) 75 MG capsule Take 1 capsule (75 mg total) by mouth every 12 (twelve) hours. 08/28/22  Yes Dione Booze, MD  acetaminophen (TYLENOL) 500 MG tablet Take 500 mg by mouth every 6 (six) hours as needed. For pain    [provider]  cyclobenzaprine (FLEXERIL) 10 MG tablet Take 1 tablet (10 mg total) by mouth 2 (two) times daily as needed for muscle spasms. 09/23/17   Dartha Lodge, PA-C  Docosahexaenoic Acid (DHA OMEGA 3 PO) Take 1 capsule by mouth daily.    [provider]  FLUoxetine (PROZAC) 20 MG capsule Take 40 mg by mouth 2 (two) times daily.    [provider]  ibuprofen (ADVIL,MOTRIN) 800 MG tablet Take 1 tablet (800 mg total) 3 (three) times daily by mouth. 09/14/17   Eber Hong, MD  levothyroxine (SYNTHROID, LEVOTHROID) 75 MCG tablet Take 75 mcg by mouth daily.    [provider]  ondansetron (ZOFRAN ODT) 4 MG disintegrating tablet Take 1 tablet (4 mg total) every 8 (eight) hours as needed by mouth for nausea. 09/14/17   Eber Hong, MD  Prenatal Vit-Fe Fumarate-FA (PRENATAL MULTIVITAMIN) TABS Take 1 tablet by mouth daily.    [provider]      Allergies    Patient has no known allergies.    Review of  Systems   Review of Systems  Constitutional:  Positive for fever.  All other systems reviewed and are negative.   Physical Exam Updated Vital Signs BP 137/85 (BP Location: Right Arm)   Pulse 90   Temp 99.1 F (37.3 C) (Oral)   Resp 18   Ht 5\' 4"  (1.626 m)   Wt 70.8 kg   SpO2 99%   BMI 26.78 kg/m  Physical Exam Vitals and nursing note reviewed.   41 year old female, resting comfortably and in no acute distress. Vital signs are normal. Oxygen saturation is 99%, which is normal. Head is normocephalic and atraumatic. PERRLA, EOMI. Oropharynx is clear. Neck is nontender and supple without adenopathy or JVD. Back is nontender and there is no CVA tenderness. Lungs are clear without rales, wheezes, or rhonchi. Chest is nontender. Heart has regular rate and rhythm without murmur. Abdomen is soft, flat, nontender. Extremities have no cyanosis or edema, full range of motion is present. Skin is warm and dry without rash. Neurologic: Mental status is normal, cranial nerves are intact, moves all extremities equally.  ED Results / Procedures / Treatments   Labs (all labs ordered are listed, but only abnormal results are displayed) Labs Reviewed  RESP PANEL BY RT-PCR (FLU A&B, COVID) ARPGX2 - Abnormal; Notable for the following components:      Result Value   Influenza A  by PCR POSITIVE (*)    All other components within normal limits  GROUP A STREP BY PCR   Radiology DG Chest Portable 1 View  Result Date: 08/27/2022 CLINICAL DATA:  Cough, fever EXAM: PORTABLE CHEST 1 VIEW COMPARISON:  None Available. FINDINGS: The heart size and mediastinal contours are within normal limits. Both lungs are clear. The visualized skeletal structures are unremarkable. IMPRESSION: No active disease. Electronically Signed   By: Keane Police D.O.   On: 08/27/2022 22:26    Procedures Procedures    Medications Ordered in ED Medications - No data to display  ED Course/ Medical Decision Making/ A&P                            Medical Decision Making Amount and/or Complexity of Data Reviewed Radiology: ordered.  Risk Prescription drug management.   Influenza-like illness.  Differential diagnosis includes, but is not limited to, influenza, COVID-19, streptococcal pharyngitis, pneumonia, other viral respiratory tract infections.  Chest x-ray shows no evidence of pneumonia.  I have independently viewed the image, and agree with the radiologist's interpretation.  I have reviewed and interpreted her laboratory tests, my interpretation is influenza A PCR positive, influenza A infection.  No evidence of streptococcal infection or COVID-19 infection.  Patient was offered the option of oseltamivir, and she is requesting a prescription for that.  I have sent a prescription for oseltamivir to her pharmacy.  She is encouraged to keep yourself well-hydrated and use over-the-counter NSAIDs and acetaminophen as needed for fever and pain.  Return precautions discussed.  Final Clinical Impression(s) / ED Diagnoses Final diagnoses:  Influenza A    Rx / DC Orders ED Discharge Orders          Ordered    oseltamivir (TAMIFLU) 75 MG capsule  Every 12 hours        08/28/22 5465              Delora Fuel, MD [birth year] [phone]

## 2022-08-28 NOTE — Discharge Instructions (Addendum)
Rest.  Drink plenty of fluids.  Take acetaminophen and/or ibuprofen as needed for fever or aching.

## 2022-09-03 DIAGNOSIS — M25511 Pain in right shoulder: Secondary | ICD-10-CM | POA: Diagnosis not present

## 2022-09-22 ENCOUNTER — Other Ambulatory Visit: Payer: Self-pay

## 2022-09-22 ENCOUNTER — Emergency Department (HOSPITAL_BASED_OUTPATIENT_CLINIC_OR_DEPARTMENT_OTHER)
Admission: EM | Admit: 2022-09-22 | Discharge: 2022-09-22 | Disposition: A | Discharge status: Home / Self Care | Payer: BC Managed Care – PPO | Attending: Emergency Medicine | Admitting: Emergency Medicine

## 2022-09-22 ENCOUNTER — Encounter (HOSPITAL_BASED_OUTPATIENT_CLINIC_OR_DEPARTMENT_OTHER): Payer: Self-pay | Admitting: Emergency Medicine

## 2022-09-22 ENCOUNTER — Emergency Department (HOSPITAL_BASED_OUTPATIENT_CLINIC_OR_DEPARTMENT_OTHER): Payer: BC Managed Care – PPO

## 2022-09-22 DIAGNOSIS — R109 Unspecified abdominal pain: Secondary | ICD-10-CM | POA: Diagnosis not present

## 2022-09-22 DIAGNOSIS — N83201 Unspecified ovarian cyst, right side: Secondary | ICD-10-CM | POA: Diagnosis not present

## 2022-09-22 DIAGNOSIS — R1012 Left upper quadrant pain: Secondary | ICD-10-CM | POA: Diagnosis not present

## 2022-09-22 DIAGNOSIS — R1032 Left lower quadrant pain: Secondary | ICD-10-CM | POA: Diagnosis not present

## 2022-09-22 DIAGNOSIS — N83202 Unspecified ovarian cyst, left side: Secondary | ICD-10-CM | POA: Diagnosis not present

## 2022-09-22 LAB — CBC WITH DIFFERENTIAL/PLATELET
Abs Immature Granulocytes: 0.02 10*3/uL (ref 0.00–0.07)
Basophils Absolute: 0 10*3/uL (ref 0.0–0.1)
Basophils Relative: 0 %
Eosinophils Absolute: 0.1 10*3/uL (ref 0.0–0.5)
Eosinophils Relative: 2 %
HCT: 37.5 % (ref 36.0–46.0)
Hemoglobin: 12.4 g/dL (ref 12.0–15.0)
Immature Granulocytes: 0 %
Lymphocytes Relative: 26 %
Lymphs Abs: 2 10*3/uL (ref 0.7–4.0)
MCH: 29.5 pg (ref 26.0–34.0)
MCHC: 33.1 g/dL (ref 30.0–36.0)
MCV: 89.1 fL (ref 80.0–100.0)
Monocytes Absolute: 0.7 10*3/uL (ref 0.1–1.0)
Monocytes Relative: 10 %
Neutro Abs: 4.8 10*3/uL (ref 1.7–7.7)
Neutrophils Relative %: 62 %
Platelets: 267 10*3/uL (ref 150–400)
RBC: 4.21 MIL/uL (ref 3.87–5.11)
RDW: 13 % (ref 11.5–15.5)
WBC: 7.7 10*3/uL (ref 4.0–10.5)
nRBC: 0 % (ref 0.0–0.2)

## 2022-09-22 LAB — COMPREHENSIVE METABOLIC PANEL
ALT: 13 U/L (ref 0–44)
AST: 17 U/L (ref 15–41)
Albumin: 3.6 g/dL (ref 3.5–5.0)
Alkaline Phosphatase: 58 U/L (ref 38–126)
Anion gap: 5 (ref 5–15)
BUN: 15 mg/dL (ref 6–20)
CO2: 26 mmol/L (ref 22–32)
Calcium: 8.3 mg/dL — ABNORMAL LOW (ref 8.9–10.3)
Chloride: 105 mmol/L (ref 98–111)
Creatinine, Ser: 0.67 mg/dL (ref 0.44–1.00)
GFR, Estimated: >60 mL/min (ref >60)
Glucose, Bld: 83 mg/dL (ref 70–99)
Potassium: 4.1 mmol/L (ref 3.5–5.1)
Sodium: 136 mmol/L (ref 135–145)
Total Bilirubin: 0.3 mg/dL (ref 0.3–1.2)
Total Protein: 6.7 g/dL (ref 6.5–8.1)

## 2022-09-22 LAB — URINALYSIS, ROUTINE W REFLEX MICROSCOPIC
Bilirubin Urine: NEGATIVE
Glucose, UA: NEGATIVE mg/dL
Hgb urine dipstick: NEGATIVE
Ketones, ur: NEGATIVE mg/dL
Leukocytes,Ua: NEGATIVE
Nitrite: NEGATIVE
Protein, ur: NEGATIVE mg/dL
Specific Gravity, Urine: 1.02 (ref 1.005–1.030)
pH: 7 (ref 5.0–8.0)

## 2022-09-22 LAB — PREGNANCY, URINE: Preg Test, Ur: NEGATIVE

## 2022-09-22 NOTE — ED Triage Notes (Signed)
Pt c/o LT flank pain since this morning

## 2022-09-22 NOTE — Discharge Instructions (Addendum)
You were seen today for left sided flank pain. Your imaging was reassuring for no signs of acute abnormality. There was a moderate amount of fecal loading throughout the colon. I suggest trying Miralax to see if it improves your symptoms. This may also be musculoskeletal in nature. If you develop any life threatening symptoms please return to the emergency department. Otherwise, please follow up as needed with your primary care provider.

## 2022-09-22 NOTE — ED Provider Notes (Signed)
[facility] EMERGENCY DEPARTMENT Provider Note   CSN: [id number] Arrival date & time: 09/22/22  1413     History  Chief Complaint  Patient presents with   Flank Pain    [name] is a 41 y.o. female.  Patient presents to the emergency department complaining of left-sided flank pain that began this morning.  Patient denies nausea, vomiting, abdominal pain, dysuria, hematuria, fever, shortness of breath.  She denies any aggravating incident prior to the onset of pain.  States she woke up with the pain this morning.  She denies any tenderness.  Past medical history significant for GERD, which she does endorse occasionally causes nausea and vomiting but denies any worsening of symptoms at this time.  HPI     Home Medications Prior to Admission medications   Medication Sig Start Date End Date Taking? Authorizing Provider  acetaminophen (TYLENOL) 500 MG tablet Take 500 mg by mouth every 6 (six) hours as needed. For pain    [provider]  cyclobenzaprine (FLEXERIL) 10 MG tablet Take 1 tablet (10 mg total) by mouth 2 (two) times daily as needed for muscle spasms. 09/23/17   Dartha Lodge, PA-C  Docosahexaenoic Acid (DHA OMEGA 3 PO) Take 1 capsule by mouth daily.    [provider]  FLUoxetine (PROZAC) 20 MG capsule Take 40 mg by mouth 2 (two) times daily.    [provider]  ibuprofen (ADVIL,MOTRIN) 800 MG tablet Take 1 tablet (800 mg total) 3 (three) times daily by mouth. 09/14/17   Eber Hong, MD  levothyroxine (SYNTHROID, LEVOTHROID) 75 MCG tablet Take 75 mcg by mouth daily.    [provider]  ondansetron (ZOFRAN ODT) 4 MG disintegrating tablet Take 1 tablet (4 mg total) every 8 (eight) hours as needed by mouth for nausea. 09/14/17   Eber Hong, MD  oseltamivir (TAMIFLU) 75 MG capsule Take 1 capsule (75 mg total) by mouth every 12 (twelve) hours. 08/28/22   Dione Booze, MD  Prenatal Vit-Fe Fumarate-FA (PRENATAL  MULTIVITAMIN) TABS Take 1 tablet by mouth daily.    [provider]      Allergies    Patient has no known allergies.    Review of Systems   Review of Systems  Constitutional:  Negative for fever.  Respiratory:  Negative for shortness of breath.   Gastrointestinal:  Negative for abdominal pain, nausea and vomiting.  Genitourinary:  Positive for flank pain. Negative for dysuria, hematuria and urgency.    Physical Exam Updated Vital Signs BP (!) 144/97   Pulse 67   Temp 97.8 F (36.6 C) (Oral)   Resp 15   Ht 5\' 4"  (1.626 m)   Wt 70.3 kg   LMP 09/21/2022   SpO2 100%   BMI 26.61 kg/m  Physical Exam Vitals and nursing note reviewed.  Constitutional:      General: She is not in acute distress.    Appearance: She is well-developed.  HENT:     Head: Normocephalic and atraumatic.     Mouth/Throat:     Mouth: Mucous membranes are moist.  Eyes:     Conjunctiva/sclera: Conjunctivae normal.  Cardiovascular:     Rate and Rhythm: Normal rate and regular rhythm.     Heart sounds: No murmur heard. Pulmonary:     Effort: Pulmonary effort is normal. No respiratory distress.     Breath sounds: Normal breath sounds.  Abdominal:     Palpations: Abdomen is soft.     Tenderness: There is  no abdominal tenderness. There is no right CVA tenderness or left CVA tenderness.  Musculoskeletal:        General: No swelling.     Cervical back: Neck supple.  Skin:    General: Skin is warm and dry.     Capillary Refill: Capillary refill takes less than 2 seconds.  Neurological:     Mental Status: She is alert.  Psychiatric:        Mood and Affect: Mood normal.     ED Results / Procedures / Treatments   Labs (all labs ordered are listed, but only abnormal results are displayed) Labs Reviewed  COMPREHENSIVE METABOLIC PANEL - Abnormal; Notable for the following components:      Result Value   Calcium 8.3 (*)    All other components within normal limits  URINALYSIS, ROUTINE W  REFLEX MICROSCOPIC  PREGNANCY, URINE  CBC WITH DIFFERENTIAL/PLATELET    EKG None  Radiology CT Renal Stone Study  Result Date: 09/22/2022 CLINICAL DATA:  Left flank pain since this morning. EXAM: CT ABDOMEN AND PELVIS WITHOUT CONTRAST TECHNIQUE: Multidetector CT imaging of the abdomen and pelvis was performed following the standard protocol without IV contrast. RADIATION DOSE REDUCTION: This exam was performed according to the departmental dose-optimization program which includes automated exposure control, adjustment of the mA and/or kV according to patient size and/or use of iterative reconstruction technique. COMPARISON:  None Available. FINDINGS: Lower chest: No acute abnormality. Hepatobiliary: No focal liver abnormality is seen. No gallstones, gallbladder wall thickening, or biliary dilatation. Pancreas: Unremarkable. No pancreatic ductal dilatation or surrounding inflammatory changes. Spleen: Normal in size without focal abnormality. Adrenals/Urinary Tract: Adrenal glands are unremarkable. Kidneys are normal, without renal calculi, focal lesion, or hydronephrosis. Bladder is unremarkable. Stomach/Bowel: The stomach and small bowel are normal. Moderate fecal loading throughout the colon. The appendix is normal in appearance. Vascular/Lymphatic: No significant vascular findings are present. No enlarged abdominal or pelvic lymph nodes. Reproductive: The uterus is unremarkable. A 1.9 cm cyst is identified in the left ovary. A 2.6 cm cyst is identified in the right ovary. Other: No abdominal wall hernia or abnormality. No abdominopelvic ascites. Musculoskeletal: No acute or significant osseous findings. IMPRESSION: 1. No cause for the patient's symptoms identified. 2. Moderate fecal loading throughout the colon. 3. 1.9 cm cyst in the left ovary. 2.6 cm cyst in the right ovary. Electronically Signed   By: Gerome Sam III M.D.   On: 09/22/2022 15:50    Procedures Procedures    Medications  Ordered in ED Medications - No data to display  ED Course/ Medical Decision Making/ A&P                           Medical Decision Making Amount and/or Complexity of Data Reviewed Labs: ordered. Radiology: ordered.   This patient presents to the ED for concern of left-sided flank pain, this involves an extensive number of treatment options, and is a complaint that carries with it a high risk of complications and morbidity.  The differential diagnosis includes pyelonephritis, nephrolithiasis, cystitis, musculoskeletal pain, and others   Co morbidities that complicate the patient evaluation  History GERD   Additional history obtained:  External records from outside source obtained and reviewed including emergency department visit from October 25 with the patient was diagnosed with influenza A   Lab Tests:  I Ordered, and personally interpreted labs.  The pertinent results include: Grossly unremarkable CMP, CBC, urinalysis, negative pregnancy test  Imaging Studies ordered:  I ordered imaging studies including CT renal stone study I independently visualized and interpreted imaging which showed  1. No cause for the patient's symptoms identified.  2. Moderate fecal loading throughout the colon.  3. 1.9 cm cyst in the left ovary. 2.6 cm cyst in the right ovary.   I agree with the radiologist interpretation   Test / Admission - Considered:  The patient has no signs of infection on UA. Very low clinical suspicion of pyelonephritis. No signs of cystitis. No dysuria. CT shows no nephrolithiasis or obstruction. Unclear etiology of pain. Patient does have moderate fecal load throughout colon. Suggested to patient that she may try some miralax to see if it eases symptoms. Also considered musculoskeletal causes but patient remembers no aggravating event or trigger. At this point vital signs are normal and no acute cause of pain is noted. Plan to discharge patient home.           Final Clinical Impression(s) / ED Diagnoses Final diagnoses:  Left flank pain    Rx / DC Orders ED Discharge Orders     None         Pamala Duffel 09/22/22 1625    Alvira Monday, MD 09/22/22 2104

## 2022-10-01 DIAGNOSIS — M25511 Pain in right shoulder: Secondary | ICD-10-CM | POA: Diagnosis not present

## 2022-10-15 DIAGNOSIS — Z1231 Encounter for screening mammogram for malignant neoplasm of breast: Secondary | ICD-10-CM | POA: Diagnosis not present

## 2022-10-31 DIAGNOSIS — L308 Other specified dermatitis: Secondary | ICD-10-CM | POA: Diagnosis not present

## 2022-11-21 DIAGNOSIS — H9312 Tinnitus, left ear: Secondary | ICD-10-CM | POA: Diagnosis not present

## 2023-01-01 DIAGNOSIS — M25511 Pain in right shoulder: Secondary | ICD-10-CM | POA: Diagnosis not present

## 2023-01-07 ENCOUNTER — Other Ambulatory Visit: Payer: Self-pay | Admitting: Sports Medicine

## 2023-01-07 DIAGNOSIS — M25511 Pain in right shoulder: Secondary | ICD-10-CM

## 2023-02-05 DIAGNOSIS — H9312 Tinnitus, left ear: Secondary | ICD-10-CM | POA: Diagnosis not present

## 2023-02-05 DIAGNOSIS — H93A2 Pulsatile tinnitus, left ear: Secondary | ICD-10-CM | POA: Diagnosis not present

## 2023-02-12 ENCOUNTER — Other Ambulatory Visit: Payer: BC Managed Care – PPO

## 2023-04-06 ENCOUNTER — Other Ambulatory Visit: Payer: Self-pay

## 2023-04-06 ENCOUNTER — Emergency Department (HOSPITAL_BASED_OUTPATIENT_CLINIC_OR_DEPARTMENT_OTHER)
Admission: EM | Admit: 2023-04-06 | Discharge: 2023-04-06 | Disposition: A | Discharge status: Home / Self Care | Payer: BC Managed Care – PPO | Attending: Emergency Medicine | Admitting: Emergency Medicine

## 2023-04-06 ENCOUNTER — Emergency Department (HOSPITAL_BASED_OUTPATIENT_CLINIC_OR_DEPARTMENT_OTHER): Payer: BC Managed Care – PPO

## 2023-04-06 ENCOUNTER — Encounter (HOSPITAL_BASED_OUTPATIENT_CLINIC_OR_DEPARTMENT_OTHER): Payer: Self-pay

## 2023-04-06 DIAGNOSIS — D72829 Elevated white blood cell count, unspecified: Secondary | ICD-10-CM | POA: Insufficient documentation

## 2023-04-06 DIAGNOSIS — R300 Dysuria: Secondary | ICD-10-CM

## 2023-04-06 DIAGNOSIS — R319 Hematuria, unspecified: Secondary | ICD-10-CM | POA: Diagnosis not present

## 2023-04-06 DIAGNOSIS — E039 Hypothyroidism, unspecified: Secondary | ICD-10-CM | POA: Insufficient documentation

## 2023-04-06 DIAGNOSIS — R109 Unspecified abdominal pain: Secondary | ICD-10-CM | POA: Diagnosis not present

## 2023-04-06 DIAGNOSIS — R310 Gross hematuria: Secondary | ICD-10-CM | POA: Diagnosis not present

## 2023-04-06 LAB — URINALYSIS, MICROSCOPIC (REFLEX)
RBC / HPF: >50 RBC/hpf (ref 0–5)
WBC, UA: >50 WBC/hpf (ref 0–5)

## 2023-04-06 LAB — BASIC METABOLIC PANEL
Anion gap: 7 (ref 5–15)
BUN: 13 mg/dL (ref 6–20)
CO2: 27 mmol/L (ref 22–32)
Calcium: 8.5 mg/dL — ABNORMAL LOW (ref 8.9–10.3)
Chloride: 106 mmol/L (ref 98–111)
Creatinine, Ser: 0.97 mg/dL (ref 0.44–1.00)
GFR, Estimated: >60 mL/min (ref >60)
Glucose, Bld: 99 mg/dL (ref 70–99)
Potassium: 3.6 mmol/L (ref 3.5–5.1)
Sodium: 140 mmol/L (ref 135–145)

## 2023-04-06 LAB — CBC
HCT: 40.1 % (ref 36.0–46.0)
Hemoglobin: 13.6 g/dL (ref 12.0–15.0)
MCH: 30.2 pg (ref 26.0–34.0)
MCHC: 33.9 g/dL (ref 30.0–36.0)
MCV: 89.1 fL (ref 80.0–100.0)
Platelets: 266 10*3/uL (ref 150–400)
RBC: 4.5 MIL/uL (ref 3.87–5.11)
RDW: 12.5 % (ref 11.5–15.5)
WBC: 11 10*3/uL — ABNORMAL HIGH (ref 4.0–10.5)
nRBC: 0 % (ref 0.0–0.2)

## 2023-04-06 LAB — URINALYSIS, ROUTINE W REFLEX MICROSCOPIC

## 2023-04-06 LAB — PREGNANCY, URINE: Preg Test, Ur: NEGATIVE

## 2023-04-06 MED ORDER — CEPHALEXIN 250 MG PO CAPS
500.0000 mg | ORAL_CAPSULE | Freq: Once | ORAL | Status: AC
  Administered 2023-04-06: 500 mg via ORAL
  Filled 2023-04-06: qty 2

## 2023-04-06 MED ORDER — PHENAZOPYRIDINE HCL 200 MG PO TABS: 200.0000 mg | ORAL_TABLET | Freq: Three times a day (TID) | ORAL | 0 refills | Status: DC | PRN

## 2023-04-06 MED ORDER — CEFADROXIL 500 MG PO CAPS: 500.0000 mg | ORAL_CAPSULE | Freq: Two times a day (BID) | ORAL | 0 refills | Status: DC

## 2023-04-06 NOTE — ED Notes (Signed)
Bladder scan completed per order. Showed 0ml in bladder.

## 2023-04-06 NOTE — ED Provider Notes (Signed)
[place] EMERGENCY DEPARTMENT AT [facility] Provider Note   CSN: [id number] Arrival date & time: 04/06/23  0841     History  Chief Complaint  Patient presents with   Hematuria    [name] is a 42 y.o. female.   Hematuria   42 year old female presents emergency department with complaints of "bladder pressure", blood in urine, passing blood clots, as well as burning when peeing.  Patient states symptoms began this morning upon awakening.  Denies history of similar symptoms in the past.  Denies any anticoagulation, abdominal pain, flank pain, fever, trauma, chest pain, shortness of breath, vaginal symptoms, change in bowel habits.  States that she does not feel like she is currently retaining urine.  Past medical history significant for hypothyroidism, OCD, GERD,  Home Medications Prior to Admission medications   Medication Sig Start Date End Date Taking? Authorizing Provider  cefadroxil (DURICEF) 500 MG capsule Take 1 capsule (500 mg total) by mouth 2 (two) times daily. 04/06/23  Yes Sherian Maroon A, PA  phenazopyridine (PYRIDIUM) 200 MG tablet Take 1 tablet (200 mg total) by mouth 3 (three) times daily as needed for pain. 04/06/23  Yes Sherian Maroon A, PA  acetaminophen (TYLENOL) 500 MG tablet Take 500 mg by mouth every 6 (six) hours as needed. For pain    [provider]  cyclobenzaprine (FLEXERIL) 10 MG tablet Take 1 tablet (10 mg total) by mouth 2 (two) times daily as needed for muscle spasms. 09/23/17   Dartha Lodge, PA-C  Docosahexaenoic Acid (DHA OMEGA 3 PO) Take 1 capsule by mouth daily.    [provider]  FLUoxetine (PROZAC) 20 MG capsule Take 40 mg by mouth 2 (two) times daily.    [provider]  ibuprofen (ADVIL,MOTRIN) 800 MG tablet Take 1 tablet (800 mg total) 3 (three) times daily by mouth. 09/14/17   Eber Hong, MD  levothyroxine (SYNTHROID, LEVOTHROID) 75 MCG tablet Take 75 mcg by mouth daily.    [provider]  ondansetron (ZOFRAN ODT) 4 MG disintegrating tablet Take 1 tablet (4 mg total) every 8 (eight) hours as needed by mouth for nausea. 09/14/17   Eber Hong, MD  oseltamivir (TAMIFLU) 75 MG capsule Take 1 capsule (75 mg total) by mouth every 12 (twelve) hours. 08/28/22   Dione Booze, MD  Prenatal Vit-Fe Fumarate-FA (PRENATAL MULTIVITAMIN) TABS Take 1 tablet by mouth daily.    [provider]      Allergies    Patient has no known allergies.    Review of Systems   Review of Systems  Genitourinary:  Positive for hematuria.  All other systems reviewed and are negative.   Physical Exam Updated Vital Signs BP 135/82   Pulse (!) 102   Temp 98.2 F (36.8 C)   Resp 16   Ht 5\' 4"  (1.626 m)   Wt 70.3 kg   LMP 04/04/2023   SpO2 100%   BMI 26.61 kg/m  Physical Exam Vitals and nursing note reviewed.  Constitutional:      General: She is not in acute distress.    Appearance: She is well-developed.  HENT:     Head: Normocephalic and atraumatic.  Eyes:     Conjunctiva/sclera: Conjunctivae normal.  Cardiovascular:     Rate and Rhythm: Normal rate and regular rhythm.     Heart sounds: No murmur heard. Pulmonary:     Effort: Pulmonary effort is normal. No respiratory distress.     Breath sounds: Normal breath  sounds.  Abdominal:     Palpations: Abdomen is soft.     Tenderness: There is no abdominal tenderness. There is no right CVA tenderness, left CVA tenderness or guarding.  Musculoskeletal:        General: No swelling.     Cervical back: Neck supple.  Skin:    General: Skin is warm and dry.     Capillary Refill: Capillary refill takes less than 2 seconds.  Neurological:     Mental Status: She is alert.  Psychiatric:        Mood and Affect: Mood normal.     ED Results / Procedures / Treatments   Labs (all labs ordered are listed, but only abnormal results are displayed) Labs Reviewed  URINALYSIS, ROUTINE W REFLEX MICROSCOPIC - Abnormal; Notable  for the following components:      Result Value   Color, Urine RED (*)    APPearance TURBID (*)    Glucose, UA   (*)    Value: TEST NOT REPORTED DUE TO COLOR INTERFERENCE OF URINE PIGMENT   Hgb urine dipstick   (*)    Value: TEST NOT REPORTED DUE TO COLOR INTERFERENCE OF URINE PIGMENT   Bilirubin Urine   (*)    Value: TEST NOT REPORTED DUE TO COLOR INTERFERENCE OF URINE PIGMENT   Ketones, ur   (*)    Value: TEST NOT REPORTED DUE TO COLOR INTERFERENCE OF URINE PIGMENT   Protein, ur   (*)    Value: TEST NOT REPORTED DUE TO COLOR INTERFERENCE OF URINE PIGMENT   Nitrite   (*)    Value: TEST NOT REPORTED DUE TO COLOR INTERFERENCE OF URINE PIGMENT   Leukocytes,Ua   (*)    Value: TEST NOT REPORTED DUE TO COLOR INTERFERENCE OF URINE PIGMENT   All other components within normal limits  BASIC METABOLIC PANEL - Abnormal; Notable for the following components:   Calcium 8.5 (*)    All other components within normal limits  CBC - Abnormal; Notable for the following components:   WBC 11.0 (*)    All other components within normal limits  URINALYSIS, MICROSCOPIC (REFLEX) - Abnormal; Notable for the following components:   Bacteria, UA MANY (*)    All other components within normal limits  URINE CULTURE  PREGNANCY, URINE    EKG None  Radiology CT Renal Stone Study  Result Date: 04/06/2023 CLINICAL DATA:  Abdominal/flank pain and hematuria. Renal stones suspected. EXAM: CT ABDOMEN AND PELVIS WITHOUT CONTRAST TECHNIQUE: Multidetector CT imaging of the abdomen and pelvis was performed following the standard protocol without IV contrast. RADIATION DOSE REDUCTION: This exam was performed according to the departmental dose-optimization program which includes automated exposure control, adjustment of the mA and/or kV according to patient size and/or use of iterative reconstruction technique. COMPARISON:  Prior CT abdomen/pelvis 09/22/2022 FINDINGS: Lower chest: No acute abnormality. Hepatobiliary: No  focal liver abnormality is seen. No gallstones, gallbladder wall thickening, or biliary dilatation. Pancreas: Unremarkable. No pancreatic ductal dilatation or surrounding inflammatory changes. Spleen: Normal in size without focal abnormality. Adrenals/Urinary Tract: Adrenal glands are unremarkable. Kidneys are normal, without renal calculi, focal lesion, or hydronephrosis. Bladder is unremarkable. Stomach/Bowel: Stomach is within normal limits. Appendix appears normal. No evidence of bowel wall thickening, distention, or inflammatory changes. Vascular/Lymphatic: Limited evaluation in the absence of intravenous contrast. No aneurysm, significant atherosclerotic plaque or suspicious lymphadenopathy. Reproductive: Uterus and bilateral adnexa are unremarkable. Other: No abdominal wall hernia or abnormality. No abdominopelvic ascites. Musculoskeletal: No acute or  significant osseous findings. IMPRESSION: Negative CT scan of the abdomen and pelvis. No evidence of nephrolithiasis. Electronically Signed   By: Malachy Moan M.D.   On: 04/06/2023 10:13    Procedures Procedures    Medications Ordered in ED Medications  cephALEXin (KEFLEX) capsule 500 mg (500 mg Oral Given 04/06/23 0951)    ED Course/ Medical Decision Making/ A&P Clinical Course as of 04/06/23 1117  Sun Apr 06, 2023  0931 Shared decision made conversation was had with patient regarding slowly treating patient's symptoms for urinary tract infection versus obtaining laboratory studies and imaging of patient's abdomen given amount of hematuria without abdominal pain or flank pain, patient elected for more further workup at this time given no prior history of similar symptoms in the past.  Will obtain basic laboratory studies and obtain CT imaging of the abdomen. [CR]    Clinical Course User Index [CR] Peter Garter, PA                             Medical Decision Making Amount and/or Complexity of Data Reviewed Labs:  ordered. Radiology: ordered.   This patient presents to the ED for concern of dysuria/hematuria, this involves an extensive number of treatment options, and is a complaint that carries with it a high risk of complications and morbidity.  The differential diagnosis includes cystitis, nephrolithiasis, malignancy, pyelonephritis, nephritic syndrome   Co morbidities that complicate the patient evaluation  See HPI   Additional history obtained:  Additional history obtained from EMR External records from outside source obtained and reviewed including hospital records   Lab Tests:  I Ordered, and personally interpreted labs.  The pertinent results include: Mild leukocytosis of 11.  No evidence of anemia.  Platelets within range.  Mild hypocalcemia of 8.5 otherwise, electrolytes within normal limits.  No renal dysfunction.  UA noncontaminated sample but with gross amount of blood to difficult to assess; is with many bacteria and greater than 50 RBCs and WBCs.  Urine pregnancy negative.  Urine culture pending.   Imaging Studies ordered:  I ordered imaging studies including CT renal stone study I independently visualized and interpreted imaging which showed no acute intra-abdominal/pelvic abnormality I agree with the radiologist interpretation   Cardiac Monitoring: / EKG:  The patient was maintained on a cardiac monitor.  I personally viewed and interpreted the cardiac monitored which showed an underlying rhythm of: Rhythm   Consultations Obtained:  N/a   Problem List / ED Course / Critical interventions / Medication management  Hematuria, dysuria I ordered medication including Keflex  Reevaluation of the patient after these medicines showed that the patient improved I have reviewed the patients home medicines and have made adjustments as needed   Social Determinants of Health:  Denies tobacco, illicit drug use   Test / Admission - Considered:  Dysuria, hematuria Vitals  signs within normal range and stable throughout visit. Laboratory/imaging studies significant for: See above 42 year old female presents emergency department with complaints of hematuria/dysuria x 1 day.  Given gross nature of hematuria described, laboratory/imaging studies were obtained for assessment of potential renal calculi versus renal lesion/malignancy as cause of patient's gross hematuria.  CT imaging was negative for any acute process.  No evidence of urinary retention.  Patient's dysuria as well as accompanied hematuria most likely secondary to cystitis.  Will treat empirically with antibiotics and send urine culture for evaluation of bacteria present.  Patient overall well-appearing, afebrile in no acute  distress, does not meet sepsis criteria.  Will recommend follow-up with urology in outpatient setting as well as advised strict return precautions.  Treatment plan discussed at length with patient and she acknowledged understanding was agreeable to said plan. Worrisome signs and symptoms were discussed with the patient, and the patient acknowledged understanding to return to the ED if noticed. Patient was stable upon discharge.          Final Clinical Impression(s) / ED Diagnoses Final diagnoses:  Dysuria  Hematuria, unspecified type    Rx / DC Orders ED Discharge Orders          Ordered    cefadroxil (DURICEF) 500 MG capsule  2 times daily        04/06/23 1024    phenazopyridine (PYRIDIUM) 200 MG tablet  3 times daily PRN        04/06/23 1024              Peter Garter, [place] 04/06/23 1117    Ernie Avena, MD 04/06/23 1120

## 2023-04-06 NOTE — ED Notes (Signed)
ED Provider at bedside. 

## 2023-04-06 NOTE — ED Triage Notes (Signed)
Pt reports blood in urine that began this am. Reports pressure, frequency and burning

## 2023-04-06 NOTE — Discharge Instructions (Addendum)
As discussed, workup today overall reassuring.  Your kidney function was normal.  No evidence of mass or stone on CT scan.  I suspect bleeding is most likely secondary to urinary tract infection.  Will treat this with antibiotics in the outpatient setting.  Given significant amount of bleeding, will recommend follow-up with urology in the outpatient setting; see number attached your discharge papers and call to set up an appointment.  Please do not hesitate to return to emergency department if bleeding persists, you notice retention of urine, develop fever or other symptoms that we discussed.  Otherwise, we will send in antibiotics for treatment of your infection as well as medicine to help with burning sensation.

## 2023-04-08 LAB — URINE CULTURE: Culture: >=100000 — AB

## 2023-04-09 ENCOUNTER — Telehealth (HOSPITAL_BASED_OUTPATIENT_CLINIC_OR_DEPARTMENT_OTHER): Payer: Self-pay | Admitting: *Deleted

## 2023-04-09 NOTE — Telephone Encounter (Signed)
Post ED Visit - Positive Culture Follow-up  Culture report reviewed by antimicrobial stewardship pharmacist: [facility] [x]  Andreas Ohm, Pharm.D. []  Celedonio Miyamoto, Pharm.D., BCPS AQ-ID []  Garvin Fila, Pharm.D., BCPS []  Georgina Pillion, Pharm.D., BCPS []  Hardy, [address].D., BCPS, AAHIVP []  Estella Husk, Pharm.D., BCPS, AAHIVP []  Lysle Pearl, PharmD, BCPS []  Phillips Climes, PharmD, BCPS []  Agapito Games, PharmD, BCPS []  Verlan Friends, PharmD []  Mervyn Gay, PharmD, BCPS []  Vinnie Level, PharmD  [facility] []  Len Childs, PharmD []  Greer Pickerel, PharmD []  Adalberto Cole, PharmD []  Perlie Gold, Rph []  Lonell Face) Jean Rosenthal, PharmD []  Earl Many, PharmD []  Junita Push, PharmD []  Dorna Leitz, PharmD []  Terrilee Files, PharmD []  Lynann Beaver, PharmD []  Keturah Barre, PharmD []  Loralee Pacas, PharmD []  Bernadene Person, PharmD   Positive urine  culture Treated with Cefadroxil, organism sensitive to the same and no further patient follow-up is required at this time.  Bing Quarry 04/09/2023, 10:57 AM

## 2023-06-17 DIAGNOSIS — D225 Melanocytic nevi of trunk: Secondary | ICD-10-CM | POA: Diagnosis not present

## 2023-06-17 DIAGNOSIS — L723 Sebaceous cyst: Secondary | ICD-10-CM | POA: Diagnosis not present

## 2023-06-17 DIAGNOSIS — D2262 Melanocytic nevi of left upper limb, including shoulder: Secondary | ICD-10-CM | POA: Diagnosis not present

## 2023-06-17 DIAGNOSIS — D2271 Melanocytic nevi of right lower limb, including hip: Secondary | ICD-10-CM | POA: Diagnosis not present

## 2023-07-23 DIAGNOSIS — L7211 Pilar cyst: Secondary | ICD-10-CM | POA: Diagnosis not present

## 2023-08-05 DIAGNOSIS — R229 Localized swelling, mass and lump, unspecified: Secondary | ICD-10-CM | POA: Diagnosis not present

## 2023-08-26 DIAGNOSIS — E785 Hyperlipidemia, unspecified: Secondary | ICD-10-CM | POA: Diagnosis not present

## 2023-08-26 DIAGNOSIS — E039 Hypothyroidism, unspecified: Secondary | ICD-10-CM | POA: Diagnosis not present

## 2023-08-26 DIAGNOSIS — Z0000 Encounter for general adult medical examination without abnormal findings: Secondary | ICD-10-CM | POA: Diagnosis not present

## 2023-08-26 DIAGNOSIS — Z23 Encounter for immunization: Secondary | ICD-10-CM | POA: Diagnosis not present

## 2023-08-26 DIAGNOSIS — E673 Hypervitaminosis D: Secondary | ICD-10-CM | POA: Diagnosis not present

## 2023-09-03 DIAGNOSIS — N6315 Unspecified lump in the right breast, overlapping quadrants: Secondary | ICD-10-CM | POA: Diagnosis not present

## 2023-10-15 ENCOUNTER — Other Ambulatory Visit: Payer: Self-pay

## 2023-10-29 DIAGNOSIS — R519 Headache, unspecified: Secondary | ICD-10-CM | POA: Diagnosis not present

## 2023-10-29 DIAGNOSIS — R6889 Other general symptoms and signs: Secondary | ICD-10-CM | POA: Diagnosis not present

## 2023-12-02 DIAGNOSIS — Z1231 Encounter for screening mammogram for malignant neoplasm of breast: Secondary | ICD-10-CM | POA: Diagnosis not present

## 2024-06-08 ENCOUNTER — Ambulatory Visit (HOSPITAL_COMMUNITY): Admission: EM | Admit: 2024-06-08 | Discharge: 2024-06-08 | Disposition: A | Discharge status: Home / Self Care

## 2024-06-08 ENCOUNTER — Ambulatory Visit (INDEPENDENT_AMBULATORY_CARE_PROVIDER_SITE_OTHER)

## 2024-06-08 ENCOUNTER — Encounter (HOSPITAL_COMMUNITY): Payer: Self-pay

## 2024-06-08 ENCOUNTER — Ambulatory Visit (HOSPITAL_COMMUNITY): Payer: Self-pay | Admitting: Physician Assistant

## 2024-06-08 DIAGNOSIS — S022XXA Fracture of nasal bones, initial encounter for closed fracture: Secondary | ICD-10-CM | POA: Diagnosis not present

## 2024-06-08 DIAGNOSIS — S0033XA Contusion of nose, initial encounter: Secondary | ICD-10-CM

## 2024-06-08 DIAGNOSIS — R519 Headache, unspecified: Secondary | ICD-10-CM | POA: Diagnosis not present

## 2024-06-08 MED ORDER — CELECOXIB 100 MG PO CAPS: 100.0000 mg | ORAL_CAPSULE | Freq: Two times a day (BID) | ORAL | 0 refills | Status: DC
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
## 2024-06-10 ENCOUNTER — Ambulatory Visit (INDEPENDENT_AMBULATORY_CARE_PROVIDER_SITE_OTHER): Admitting: Physician Assistant

## 2024-06-10 ENCOUNTER — Encounter (INDEPENDENT_AMBULATORY_CARE_PROVIDER_SITE_OTHER): Payer: Self-pay | Admitting: Physician Assistant

## 2024-06-10 VITALS — BP 127/87 | HR 80

## 2024-06-10 DIAGNOSIS — S0992XA Unspecified injury of nose, initial encounter: Secondary | ICD-10-CM | POA: Diagnosis not present
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
## 2024-06-11 ENCOUNTER — Encounter (INDEPENDENT_AMBULATORY_CARE_PROVIDER_SITE_OTHER): Payer: Self-pay

## 2024-07-14 DIAGNOSIS — H93A2 Pulsatile tinnitus, left ear: Secondary | ICD-10-CM | POA: Diagnosis not present

## 2024-07-14 DIAGNOSIS — H918X3 Other specified hearing loss, bilateral: Secondary | ICD-10-CM | POA: Diagnosis not present

## 2024-07-23 ENCOUNTER — Ambulatory Visit: Admitting: Diagnostic Neuroimaging

## 2024-07-23 ENCOUNTER — Encounter: Payer: Self-pay | Admitting: Diagnostic Neuroimaging

## 2024-07-23 VITALS — BP 150/100 | HR 74 | Ht 64.0 in | Wt 162.0 lb

## 2024-07-23 DIAGNOSIS — H9312 Tinnitus, left ear: Secondary | ICD-10-CM | POA: Diagnosis not present

## 2024-07-23 DIAGNOSIS — H9072 Mixed conductive and sensorineural hearing loss, unilateral, left ear, with unrestricted hearing on the contralateral side: Secondary | ICD-10-CM | POA: Diagnosis not present

## 2024-07-23 NOTE — Progress Notes (Signed)
 [facility]  PATIENT: [name] DOB: 1981-04-10  REFERRING CLINICIAN: Heisler, Melissa, OD HISTORY FROM: patient  REASON FOR VISIT: new consult   HISTORICAL  CHIEF COMPLAINT:  Chief Complaint  Patient presents with   New Patient (Initial Visit)    Rm 7, alone.  Hx migraines.  R pupil larger then L.  Ophthalmologist seen.      HISTORY OF PRESENT ILLNESS:   43 year old female here for evaluation of headaches, abnormal pupils, tinnitus.  For past few years patient has noticed some slight pupillary difference with right pupil slightly larger than the left when she wakes up early in the morning.  This pupillary difference normalizes later in the day.  Also headaches starting in teenage years with pressure, throbbing, brain fog, sensitivity to light and sound, in the frontal and temporal regions.  She is previously diagnosed with atypical migraine.  Averaging 1-2 headaches per month.  These are usually well treated with Aleve and [employer].  Patient also has developed some left sided pulsatile tinnitus since age 50 years old.  She has also developed some mild left-sided hearing loss more recently.  Has been to ENT and was considered to have neuroimaging but this has not been done yet.  Also went to optometry clinic for evaluation.  No papilledema noted.  No pupillary abnormality.  However possibility of IIH was raised due to the tinnitus.   REVIEW OF SYSTEMS: Full 14 system review of systems performed and negative with exception of: As per HPI.  ALLERGIES: No Known Allergies  HOME MEDICATIONS: Outpatient Medications Prior to Visit  Medication Sig Dispense Refill   dextroamphetamine (DEXTROSTAT) 10 MG tablet Take 5-10 mg by mouth daily.     FLUoxetine  (PROZAC ) 20 MG capsule Take 40 mg by mouth 2 (two) times daily. (Patient taking differently: Take 40 mg by mouth 2 (two) times daily.)     l-methylfolate-B6-B12 (METANX) 3-35-2 MG TABS tablet Take 1  tablet by mouth daily.     levothyroxine  (SYNTHROID , LEVOTHROID) 75 MCG tablet Take 75 mcg by mouth daily.     lisdexamfetamine (VYVANSE) 40 MG capsule Take 40 mg by mouth.     Multiple Vitamin (MULTI-VITAMIN) tablet Take 1 tablet by mouth daily.     Naproxen Sodium (ALEVE) 220 MG CAPS Take by mouth 2 (two) times daily as needed (for headaches).     ondansetron  (ZOFRAN  ODT) 4 MG disintegrating tablet Take 1 tablet (4 mg total) every 8 (eight) hours as needed by mouth for nausea. 10 tablet 0   vortioxetine HBr (TRINTELLIX) 20 MG TABS tablet      acetaminophen  (TYLENOL ) 500 MG tablet Take 500 mg by mouth every 6 (six) hours as needed. For pain     celecoxib  (CELEBREX ) 100 MG capsule Take 1 capsule (100 mg total) by mouth 2 (two) times daily. 30 capsule 0   cyclobenzaprine  (FLEXERIL ) 10 MG tablet Take 1 tablet (10 mg total) by mouth 2 (two) times daily as needed for muscle spasms. 20 tablet 0   Docosahexaenoic Acid (DHA OMEGA 3 PO) Take 1 capsule by mouth daily.     No facility-administered medications prior to visit.    PAST MEDICAL HISTORY: Past Medical History:  Diagnosis Date   Acute upper respiratory infections of unspecified site    AMA (advanced maternal age) multigravida 35+    GERD (gastroesophageal reflux disease)    Headache(784.0)    Hypothyroidism    Missed abortion    Obsessive-compulsive disorders    Other anomalies  of uterus    PP care - s/p SVD 11/19/2011    PAST SURGICAL HISTORY: Past Surgical History:  Procedure Laterality Date   WISDOM TOOTH EXTRACTION      FAMILY HISTORY: Family History  Problem Relation Age of Onset   Hypertension Mother    Depression Mother    Miscarriages / [place] Mother    Nephrolithiasis Father    Anxiety disorder Father    Psoriasis Father    Hypothyroidism Sister    Depression Sister    Anxiety disorder Sister    Cancer Maternal Grandfather        lung   Hypothyroidism Paternal Grandmother    Anxiety disorder Paternal  Grandmother    Cancer Paternal Grandfather        colon   Stroke Paternal Grandfather     SOCIAL HISTORY: Social History   Socioeconomic History   Marital status: Married    Spouse name: Not on file   Number of children: Not on file   Years of education: Not on file   Highest education level: Not on file  Occupational History   Not on file  Tobacco Use   Smoking status: Never   Smokeless tobacco: Never  Vaping Use   Vaping status: Never Used  Substance and Sexual Activity   Alcohol use: Yes    Comment: 4-5 weeks (more on weekend)   Drug use: No   Sexual activity: Yes  Other Topics Concern   Not on file  Social History Narrative   Caffiene  rare (has GERD)  1-2 twice weekly   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    Social Drivers of Corporate investment banker Strain: Not on file  Food Insecurity: Not on file  Transportation Needs: Not on file  Physical Activity: Not on file  Stress: Not on file  Social Connections: Not on file  Intimate Partner Violence: Not on file     PHYSICAL EXAM  GENERAL EXAM/CONSTITUTIONAL: Vitals:  Vitals:   07/23/24 1023  BP: (!) 150/100  Pulse: 74  Weight: 162 lb (73.5 kg)  Height: 5' 4 (1.626 m)   Body mass index is 27.81 kg/m. Wt Readings from Last 3 Encounters:  07/23/24 162 lb (73.5 kg)  04/06/23 155 lb (70.3 kg)  09/22/22 155 lb (70.3 kg)   Patient is in no distress; well developed, nourished and groomed; neck is supple  CARDIOVASCULAR: Examination of carotid arteries is normal; no carotid bruits Regular rate and rhythm, no murmurs Examination of peripheral vascular system by observation and palpation is normal  EYES: Ophthalmoscopic exam of optic discs and posterior segments is normal; no papilledema or hemorrhages Vision Screening   Right eye Left eye Both eyes  Without correction     With correction 20/20 20/20     MUSCULOSKELETAL: Gait, strength, tone, movements noted in  Neurologic exam below  NEUROLOGIC: MENTAL STATUS:      No data to display         awake, alert, oriented to person, place and time recent and remote memory intact normal attention and concentration language fluent, comprehension intact, naming intact fund of knowledge appropriate  CRANIAL NERVE:  2nd - no papilledema on fundoscopic exam 2nd, 3rd, 4th, 6th - pupils equal and reactive to light (IN BRIGHT LIGHT AND DIM ROOM), visual fields full to confrontation, extraocular muscles intact, no nystagmus 5th - facial sensation symmetric 7th - facial strength symmetric 8th - hearing intact 9th -  palate elevates symmetrically, uvula midline 11th - shoulder shrug symmetric 12th - tongue protrusion midline  MOTOR:  normal bulk and tone, full strength in the BUE, BLE  SENSORY:  normal and symmetric to light touch, temperature, vibration  COORDINATION:  finger-nose-finger, fine finger movements normal  REFLEXES:  deep tendon reflexes present and symmetric  GAIT/STATION:  narrow based gait     DIAGNOSTIC DATA (LABS, IMAGING, TESTING) - I reviewed patient records, labs, notes, testing and imaging myself where available.  Lab Results  Component Value Date   WBC 11.0 (H) 04/06/2023   HGB 13.6 04/06/2023   HCT 40.1 04/06/2023   MCV 89.1 04/06/2023   PLT 266 04/06/2023      Component Value Date/Time   NA 140 04/06/2023 0950   K 3.6 04/06/2023 0950   CL 106 04/06/2023 0950   CO2 27 04/06/2023 0950   GLUCOSE 99 04/06/2023 0950   BUN 13 04/06/2023 0950   CREATININE 0.97 04/06/2023 0950   CALCIUM 8.5 (L) 04/06/2023 0950   PROT 6.7 09/22/2022 1455   ALBUMIN 3.6 09/22/2022 1455   AST 17 09/22/2022 1455   ALT 13 09/22/2022 1455   ALKPHOS 58 09/22/2022 1455   BILITOT 0.3 09/22/2022 1455   GFRNONAA >60 04/06/2023 0950   No results found for: CHOL, HDL, LDLCALC, LDLDIRECT, TRIG, CHOLHDL No results found for: YHAJ8R No results found for: VITAMINB12 No  results found for: TSH     ASSESSMENT AND PLAN  43 y.o. year old female here with:   Dx:  1. Left-sided tinnitus   2. 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     PLAN:  Left pulsatile tinnitus + left hearing loss - check MRI brain / IAC (with and without)  - may consider CTA / CTV head in future (to rule out vascular malformation and venous sinus stenosis)  HEADACHES (photophobia, phonophobia, throbbing, brain fog; 1-2 per month)  - suspect migraine without aura - continue OTC treatments (aleve + caffeine) - no papilledema; low suspicion for IIH at this time (no other obvious risk factors; clinical symptoms not that consistent with IIH)  TRANSIENT PUPILLARY ASYMMETRY (usually in early morning; showed me a picture on phone with right pupil 3mm, left pupil 2mm) - neuro exam normal today; pupils equal and reactive in bright and dim conditions - could be normal physiologic variant  Orders Placed This Encounter  Procedures   MR BRAIN/IAC W WO CONTRAST   Return for pending if symptoms worsen or fail to improve, pending test results.  I reviewed labs, notes, records myself. I summarized findings and reviewed with patient, for this high risk condition (worsening HA, left hearing loss, left tinnitus; need to rule out autoimmune, inflam, mass lesions) requiring high complexity decision making.    EDUARD FABIENE HANLON, MD 07/23/2024, 11:12 AM Certified in Neurology, Neurophysiology and Neuroimaging  [facility] [address] [place], [place] [address] [phone]

## 2024-07-23 NOTE — Patient Instructions (Signed)
  Left tinnitus + left hearing loss - check MRI brain / IAC (with and without)  - may consider CTA / CTV head in future  HEADACHES (photophobia, phonophobia, throbbing, brain fog; 1-2 per month)  - suspect migraine without aura - continue OTC treatments (aleve + caffeine)

## 2024-08-08 ENCOUNTER — Encounter: Payer: Self-pay | Admitting: Diagnostic Neuroimaging

## 2024-08-10 ENCOUNTER — Ambulatory Visit (INDEPENDENT_AMBULATORY_CARE_PROVIDER_SITE_OTHER)

## 2024-08-10 DIAGNOSIS — H9312 Tinnitus, left ear: Secondary | ICD-10-CM | POA: Diagnosis not present

## 2024-08-10 DIAGNOSIS — H9072 Mixed conductive and sensorineural hearing loss, unilateral, left ear, with unrestricted hearing on the contralateral side: Secondary | ICD-10-CM | POA: Diagnosis not present

## 2024-08-10 MED ORDER — GADOBENATE DIMEGLUMINE 529 MG/ML IV SOLN
15.0000 mL | Freq: Once | INTRAVENOUS | Status: AC | PRN
  Administered 2024-08-10: 15 mL via INTRAVENOUS

## 2024-08-16 ENCOUNTER — Ambulatory Visit: Payer: Self-pay | Admitting: Diagnostic Neuroimaging

## 2024-08-31 DIAGNOSIS — E039 Hypothyroidism, unspecified: Secondary | ICD-10-CM | POA: Diagnosis not present

## 2024-08-31 DIAGNOSIS — E785 Hyperlipidemia, unspecified: Secondary | ICD-10-CM | POA: Diagnosis not present

## 2024-08-31 DIAGNOSIS — Z0000 Encounter for general adult medical examination without abnormal findings: Secondary | ICD-10-CM | POA: Diagnosis not present

## 2024-08-31 DIAGNOSIS — K649 Unspecified hemorrhoids: Secondary | ICD-10-CM | POA: Diagnosis not present

## 2024-08-31 DIAGNOSIS — K219 Gastro-esophageal reflux disease without esophagitis: Secondary | ICD-10-CM | POA: Diagnosis not present

## 2024-08-31 DIAGNOSIS — Z23 Encounter for immunization: Secondary | ICD-10-CM | POA: Diagnosis not present

## 2024-08-31 DIAGNOSIS — E673 Hypervitaminosis D: Secondary | ICD-10-CM | POA: Diagnosis not present

## 2024-08-31 DIAGNOSIS — Z124 Encounter for screening for malignant neoplasm of cervix: Secondary | ICD-10-CM | POA: Diagnosis not present

## 2024-09-21 DIAGNOSIS — D2261 Melanocytic nevi of right upper limb, including shoulder: Secondary | ICD-10-CM | POA: Diagnosis not present

## 2024-09-21 DIAGNOSIS — L7211 Pilar cyst: Secondary | ICD-10-CM | POA: Diagnosis not present

## 2024-09-21 DIAGNOSIS — L814 Other melanin hyperpigmentation: Secondary | ICD-10-CM | POA: Diagnosis not present

## 2024-09-21 DIAGNOSIS — L710 Perioral dermatitis: Secondary | ICD-10-CM | POA: Diagnosis not present

## 2024-10-05 DIAGNOSIS — R200 Anesthesia of skin: Secondary | ICD-10-CM | POA: Diagnosis not present

## 2024-10-05 DIAGNOSIS — M25511 Pain in right shoulder: Secondary | ICD-10-CM | POA: Diagnosis not present

## 2024-10-06 DIAGNOSIS — R200 Anesthesia of skin: Secondary | ICD-10-CM | POA: Diagnosis not present
